# Patient Record
Sex: Male | Born: 1955
Health system: Southern US, Community
[De-identification: ages and names within clinical notes are randomized; demographics above are authoritative.]

## PROBLEM LIST (undated history)

## (undated) DIAGNOSIS — G20A1 Parkinson's disease without dyskinesia, without mention of fluctuations: Secondary | ICD-10-CM

## (undated) DIAGNOSIS — T7840XA Allergy, unspecified, initial encounter: Secondary | ICD-10-CM

## (undated) DIAGNOSIS — J45909 Unspecified asthma, uncomplicated: Secondary | ICD-10-CM

## (undated) DIAGNOSIS — E785 Hyperlipidemia, unspecified: Secondary | ICD-10-CM

## (undated) DIAGNOSIS — G709 Myoneural disorder, unspecified: Secondary | ICD-10-CM

## (undated) DIAGNOSIS — I10 Essential (primary) hypertension: Secondary | ICD-10-CM

## (undated) HISTORY — DX: Allergy, unspecified, initial encounter: T78.40XA

## (undated) HISTORY — DX: Myoneural disorder, unspecified: G70.9

## (undated) HISTORY — DX: Parkinson's disease without dyskinesia, without mention of fluctuations: G20.A1

## (undated) HISTORY — DX: Essential (primary) hypertension: I10

## (undated) HISTORY — DX: Unspecified asthma, uncomplicated: J45.909

## (undated) HISTORY — PX: HERNIA REPAIR: SHX51

## (undated) HISTORY — PX: VASECTOMY: SHX75

## (undated) HISTORY — DX: Hyperlipidemia, unspecified: E78.5

---

## 1998-06-18 ENCOUNTER — Ambulatory Visit (HOSPITAL_COMMUNITY): Admission: RE | Admit: 1998-06-18 | Discharge: 1998-06-18 | Payer: Self-pay | Admitting: Gastroenterology

## 1998-10-01 ENCOUNTER — Ambulatory Visit (HOSPITAL_COMMUNITY): Admission: RE | Admit: 1998-10-01 | Discharge: 1998-10-01 | Payer: Self-pay | Admitting: Gastroenterology

## 1999-11-04 ENCOUNTER — Ambulatory Visit (HOSPITAL_BASED_OUTPATIENT_CLINIC_OR_DEPARTMENT_OTHER): Admission: RE | Admit: 1999-11-04 | Discharge: 1999-11-04 | Payer: Self-pay | Admitting: Urology

## 2006-09-14 ENCOUNTER — Ambulatory Visit: Payer: Self-pay | Admitting: Family Medicine

## 2006-09-14 LAB — CONVERTED CEMR LAB
CO2: 30 meq/L (ref 19–32)
Chloride: 107 meq/L (ref 96–112)
Cholesterol: 171 mg/dL (ref 0–200)
GFR calc non Af Amer: 84 mL/min
Glucose, Bld: 100 mg/dL — ABNORMAL HIGH (ref 70–99)
HDL: 56.2 mg/dL (ref >39.0)
LDL Cholesterol: 105 mg/dL — ABNORMAL HIGH (ref 0–99)
PSA: 0.73 ng/mL (ref 0.10–4.00)
Sodium: 142 meq/L (ref 135–145)
Total CHOL/HDL Ratio: 3
Uric Acid, Serum: 4.2 mg/dL (ref 2.4–7.0)

## 2006-10-19 ENCOUNTER — Ambulatory Visit: Payer: Self-pay | Admitting: Family Medicine

## 2006-10-19 LAB — CONVERTED CEMR LAB
Calcium: 9.4 mg/dL (ref 8.4–10.5)
Chloride: 102 meq/L (ref 96–112)
Creatinine, Ser: 0.9 mg/dL (ref 0.4–1.5)
GFR calc non Af Amer: 95 mL/min
Sodium: 141 meq/L (ref 135–145)

## 2006-12-06 ENCOUNTER — Encounter (INDEPENDENT_AMBULATORY_CARE_PROVIDER_SITE_OTHER): Payer: Self-pay | Admitting: Family Medicine

## 2007-04-11 ENCOUNTER — Telehealth (INDEPENDENT_AMBULATORY_CARE_PROVIDER_SITE_OTHER): Payer: Self-pay | Admitting: *Deleted

## 2007-04-16 ENCOUNTER — Telehealth (INDEPENDENT_AMBULATORY_CARE_PROVIDER_SITE_OTHER): Payer: Self-pay | Admitting: *Deleted

## 2007-05-08 ENCOUNTER — Telehealth (INDEPENDENT_AMBULATORY_CARE_PROVIDER_SITE_OTHER): Payer: Self-pay | Admitting: Family Medicine

## 2007-06-18 ENCOUNTER — Telehealth (INDEPENDENT_AMBULATORY_CARE_PROVIDER_SITE_OTHER): Payer: Self-pay | Admitting: *Deleted

## 2007-07-01 ENCOUNTER — Telehealth (INDEPENDENT_AMBULATORY_CARE_PROVIDER_SITE_OTHER): Payer: Self-pay | Admitting: *Deleted

## 2007-07-29 ENCOUNTER — Telehealth (INDEPENDENT_AMBULATORY_CARE_PROVIDER_SITE_OTHER): Payer: Self-pay | Admitting: *Deleted

## 2007-10-17 ENCOUNTER — Ambulatory Visit: Payer: Self-pay | Admitting: Family Medicine

## 2007-10-17 LAB — CONVERTED CEMR LAB
CO2: 29 meq/L (ref 19–32)
Calcium: 9.6 mg/dL (ref 8.4–10.5)
Cholesterol: 160 mg/dL (ref 0–200)
GFR calc Af Amer: 101 mL/min
GFR calc non Af Amer: 83 mL/min
Glucose, Bld: 86 mg/dL (ref 70–99)
HDL: 69.4 mg/dL (ref >39.0)
LDL Cholesterol: 81 mg/dL (ref 0–99)
PSA: 0.61 ng/mL (ref 0.10–4.00)
Potassium: 3.6 meq/L (ref 3.5–5.1)
Sodium: 136 meq/L (ref 135–145)
Total CHOL/HDL Ratio: 2.3
Triglycerides: 50 mg/dL (ref 0–149)

## 2007-10-18 ENCOUNTER — Encounter (INDEPENDENT_AMBULATORY_CARE_PROVIDER_SITE_OTHER): Payer: Self-pay | Admitting: *Deleted

## 2007-11-05 ENCOUNTER — Telehealth (INDEPENDENT_AMBULATORY_CARE_PROVIDER_SITE_OTHER): Payer: Self-pay | Admitting: *Deleted

## 2007-12-10 ENCOUNTER — Telehealth (INDEPENDENT_AMBULATORY_CARE_PROVIDER_SITE_OTHER): Payer: Self-pay | Admitting: *Deleted

## 2007-12-13 ENCOUNTER — Encounter (INDEPENDENT_AMBULATORY_CARE_PROVIDER_SITE_OTHER): Payer: Self-pay | Admitting: *Deleted

## 2008-02-03 ENCOUNTER — Telehealth (INDEPENDENT_AMBULATORY_CARE_PROVIDER_SITE_OTHER): Payer: Self-pay | Admitting: *Deleted

## 2008-02-03 ENCOUNTER — Encounter (INDEPENDENT_AMBULATORY_CARE_PROVIDER_SITE_OTHER): Payer: Self-pay | Admitting: *Deleted

## 2008-05-05 ENCOUNTER — Telehealth (INDEPENDENT_AMBULATORY_CARE_PROVIDER_SITE_OTHER): Payer: Self-pay | Admitting: *Deleted

## 2008-05-07 ENCOUNTER — Telehealth (INDEPENDENT_AMBULATORY_CARE_PROVIDER_SITE_OTHER): Payer: Self-pay | Admitting: *Deleted

## 2008-06-03 ENCOUNTER — Encounter (INDEPENDENT_AMBULATORY_CARE_PROVIDER_SITE_OTHER): Payer: Self-pay | Admitting: *Deleted

## 2008-07-29 ENCOUNTER — Telehealth (INDEPENDENT_AMBULATORY_CARE_PROVIDER_SITE_OTHER): Payer: Self-pay | Admitting: *Deleted

## 2008-09-02 ENCOUNTER — Telehealth (INDEPENDENT_AMBULATORY_CARE_PROVIDER_SITE_OTHER): Payer: Self-pay | Admitting: *Deleted

## 2008-10-12 ENCOUNTER — Telehealth (INDEPENDENT_AMBULATORY_CARE_PROVIDER_SITE_OTHER): Payer: Self-pay | Admitting: *Deleted

## 2008-10-20 ENCOUNTER — Encounter (INDEPENDENT_AMBULATORY_CARE_PROVIDER_SITE_OTHER): Payer: Self-pay | Admitting: *Deleted

## 2008-10-20 ENCOUNTER — Ambulatory Visit: Payer: Self-pay | Admitting: Family Medicine

## 2008-10-21 ENCOUNTER — Encounter: Payer: Self-pay | Admitting: Family Medicine

## 2008-10-28 ENCOUNTER — Encounter (INDEPENDENT_AMBULATORY_CARE_PROVIDER_SITE_OTHER): Payer: Self-pay | Admitting: *Deleted

## 2008-11-26 ENCOUNTER — Telehealth (INDEPENDENT_AMBULATORY_CARE_PROVIDER_SITE_OTHER): Payer: Self-pay | Admitting: *Deleted

## 2008-12-02 ENCOUNTER — Encounter (INDEPENDENT_AMBULATORY_CARE_PROVIDER_SITE_OTHER): Payer: Self-pay | Admitting: *Deleted

## 2008-12-07 ENCOUNTER — Telehealth (INDEPENDENT_AMBULATORY_CARE_PROVIDER_SITE_OTHER): Payer: Self-pay | Admitting: *Deleted

## 2008-12-24 ENCOUNTER — Telehealth (INDEPENDENT_AMBULATORY_CARE_PROVIDER_SITE_OTHER): Payer: Self-pay | Admitting: *Deleted

## 2009-01-06 ENCOUNTER — Telehealth (INDEPENDENT_AMBULATORY_CARE_PROVIDER_SITE_OTHER): Payer: Self-pay | Admitting: *Deleted

## 2009-01-20 ENCOUNTER — Telehealth (INDEPENDENT_AMBULATORY_CARE_PROVIDER_SITE_OTHER): Payer: Self-pay | Admitting: *Deleted

## 2009-02-09 ENCOUNTER — Telehealth (INDEPENDENT_AMBULATORY_CARE_PROVIDER_SITE_OTHER): Payer: Self-pay | Admitting: *Deleted

## 2009-03-25 ENCOUNTER — Encounter: Payer: Self-pay | Admitting: Family Medicine

## 2009-04-29 ENCOUNTER — Telehealth (INDEPENDENT_AMBULATORY_CARE_PROVIDER_SITE_OTHER): Payer: Self-pay | Admitting: *Deleted

## 2009-05-10 ENCOUNTER — Telehealth (INDEPENDENT_AMBULATORY_CARE_PROVIDER_SITE_OTHER): Payer: Self-pay | Admitting: *Deleted

## 2009-05-11 ENCOUNTER — Telehealth: Payer: Self-pay | Admitting: Family Medicine

## 2009-05-14 ENCOUNTER — Telehealth: Payer: Self-pay | Admitting: Family Medicine

## 2009-05-17 ENCOUNTER — Telehealth (INDEPENDENT_AMBULATORY_CARE_PROVIDER_SITE_OTHER): Payer: Self-pay | Admitting: *Deleted

## 2009-07-05 ENCOUNTER — Encounter: Payer: Self-pay | Admitting: Family

## 2009-07-05 ENCOUNTER — Telehealth (INDEPENDENT_AMBULATORY_CARE_PROVIDER_SITE_OTHER): Payer: Self-pay | Admitting: *Deleted

## 2009-07-12 ENCOUNTER — Telehealth (INDEPENDENT_AMBULATORY_CARE_PROVIDER_SITE_OTHER): Payer: Self-pay | Admitting: *Deleted

## 2009-07-13 ENCOUNTER — Ambulatory Visit: Payer: Self-pay | Admitting: Family Medicine

## 2009-07-19 LAB — CONVERTED CEMR LAB
Albumin: 4.6 g/dL (ref 3.5–5.2)
HDL: 71.1 mg/dL (ref >39.00)
LDL Cholesterol: 90 mg/dL (ref 0–99)
Total Bilirubin: 1.2 mg/dL (ref 0.3–1.2)
Total CHOL/HDL Ratio: 2
Triglycerides: 63 mg/dL (ref 0.0–149.0)

## 2009-08-02 ENCOUNTER — Telehealth: Payer: Self-pay | Admitting: Family Medicine

## 2009-08-03 ENCOUNTER — Telehealth (INDEPENDENT_AMBULATORY_CARE_PROVIDER_SITE_OTHER): Payer: Self-pay | Admitting: *Deleted

## 2009-08-06 ENCOUNTER — Telehealth: Payer: Self-pay | Admitting: Family Medicine

## 2009-08-09 ENCOUNTER — Telehealth: Payer: Self-pay | Admitting: Family Medicine

## 2009-08-10 ENCOUNTER — Telehealth (INDEPENDENT_AMBULATORY_CARE_PROVIDER_SITE_OTHER): Payer: Self-pay | Admitting: *Deleted

## 2009-08-30 ENCOUNTER — Telehealth (INDEPENDENT_AMBULATORY_CARE_PROVIDER_SITE_OTHER): Payer: Self-pay | Admitting: *Deleted

## 2009-08-31 ENCOUNTER — Telehealth (INDEPENDENT_AMBULATORY_CARE_PROVIDER_SITE_OTHER): Payer: Self-pay | Admitting: *Deleted

## 2009-09-06 ENCOUNTER — Telehealth: Payer: Self-pay | Admitting: Family Medicine

## 2009-10-04 ENCOUNTER — Telehealth (INDEPENDENT_AMBULATORY_CARE_PROVIDER_SITE_OTHER): Payer: Self-pay | Admitting: *Deleted

## 2009-10-07 ENCOUNTER — Telehealth (INDEPENDENT_AMBULATORY_CARE_PROVIDER_SITE_OTHER): Payer: Self-pay | Admitting: *Deleted

## 2009-10-22 ENCOUNTER — Ambulatory Visit: Payer: Self-pay | Admitting: Family Medicine

## 2009-11-01 ENCOUNTER — Telehealth (INDEPENDENT_AMBULATORY_CARE_PROVIDER_SITE_OTHER): Payer: Self-pay | Admitting: *Deleted

## 2009-11-02 ENCOUNTER — Telehealth (INDEPENDENT_AMBULATORY_CARE_PROVIDER_SITE_OTHER): Payer: Self-pay | Admitting: *Deleted

## 2010-01-11 ENCOUNTER — Ambulatory Visit: Payer: Self-pay | Admitting: Family Medicine

## 2010-01-12 LAB — CONVERTED CEMR LAB
AST: 31 units/L (ref 0–37)
Albumin: 4.6 g/dL (ref 3.5–5.2)
Alkaline Phosphatase: 52 units/L (ref 39–117)
Cholesterol: 174 mg/dL (ref 0–200)
Total Protein: 7 g/dL (ref 6.0–8.3)
VLDL: 17.8 mg/dL (ref 0.0–40.0)

## 2010-01-25 ENCOUNTER — Telehealth (INDEPENDENT_AMBULATORY_CARE_PROVIDER_SITE_OTHER): Payer: Self-pay | Admitting: *Deleted

## 2010-02-07 ENCOUNTER — Telehealth (INDEPENDENT_AMBULATORY_CARE_PROVIDER_SITE_OTHER): Payer: Self-pay | Admitting: *Deleted

## 2010-02-23 ENCOUNTER — Telehealth (INDEPENDENT_AMBULATORY_CARE_PROVIDER_SITE_OTHER): Payer: Self-pay | Admitting: *Deleted

## 2010-05-30 ENCOUNTER — Telehealth (INDEPENDENT_AMBULATORY_CARE_PROVIDER_SITE_OTHER): Payer: Self-pay | Admitting: *Deleted

## 2010-06-07 ENCOUNTER — Telehealth: Payer: Self-pay | Admitting: Family Medicine

## 2010-09-05 ENCOUNTER — Telehealth (INDEPENDENT_AMBULATORY_CARE_PROVIDER_SITE_OTHER): Payer: Self-pay | Admitting: *Deleted

## 2010-09-19 ENCOUNTER — Telehealth (INDEPENDENT_AMBULATORY_CARE_PROVIDER_SITE_OTHER): Payer: Self-pay | Admitting: *Deleted

## 2010-09-23 ENCOUNTER — Other Ambulatory Visit: Payer: Self-pay | Admitting: Family Medicine

## 2010-09-23 ENCOUNTER — Ambulatory Visit
Admission: RE | Admit: 2010-09-23 | Discharge: 2010-09-23 | Payer: Self-pay | Source: Home / Self Care | Attending: Family Medicine | Admitting: Family Medicine

## 2010-09-23 LAB — CBC WITH DIFFERENTIAL/PLATELET
Basophils Absolute: 0 10*3/uL (ref 0.0–0.1)
Basophils Relative: 0.6 % (ref 0.0–3.0)
Eosinophils Absolute: 0.2 10*3/uL (ref 0.0–0.7)
Eosinophils Relative: 4.2 % (ref 0.0–5.0)
HCT: 44.3 % (ref 39.0–52.0)
Hemoglobin: 15.4 g/dL (ref 13.0–17.0)
Lymphocytes Relative: 45.9 % (ref 12.0–46.0)
Lymphs Abs: 2.4 10*3/uL (ref 0.7–4.0)
MCHC: 34.8 g/dL (ref 30.0–36.0)
MCV: 97.5 fl (ref 78.0–100.0)
Monocytes Absolute: 0.4 10*3/uL (ref 0.1–1.0)
Monocytes Relative: 7.3 % (ref 3.0–12.0)
Neutro Abs: 2.2 10*3/uL (ref 1.4–7.7)
Neutrophils Relative %: 42 % — ABNORMAL LOW (ref 43.0–77.0)
Platelets: 190 10*3/uL (ref 150.0–400.0)
RBC: 4.55 Mil/uL (ref 4.22–5.81)
RDW: 13.4 % (ref 11.5–14.6)
WBC: 5.3 10*3/uL (ref 4.5–10.5)

## 2010-09-23 LAB — LIPID PANEL
Cholesterol: 164 mg/dL (ref 0–200)
HDL: 60.7 mg/dL (ref >39.00)
LDL Cholesterol: 94 mg/dL (ref 0–99)
Total CHOL/HDL Ratio: 3
Triglycerides: 47 mg/dL (ref 0.0–149.0)
VLDL: 9.4 mg/dL (ref 0.0–40.0)

## 2010-09-23 LAB — BASIC METABOLIC PANEL
BUN: 27 mg/dL — ABNORMAL HIGH (ref 6–23)
CO2: 28 mEq/L (ref 19–32)
Calcium: 9.3 mg/dL (ref 8.4–10.5)
Chloride: 103 mEq/L (ref 96–112)
Creatinine, Ser: 0.9 mg/dL (ref 0.4–1.5)
GFR: 91.76 mL/min (ref >60.00)
Glucose, Bld: 96 mg/dL (ref 70–99)
Potassium: 4.1 mEq/L (ref 3.5–5.1)
Sodium: 138 mEq/L (ref 135–145)

## 2010-09-23 LAB — HEPATIC FUNCTION PANEL
ALT: 21 U/L (ref 0–53)
AST: 26 U/L (ref 0–37)
Albumin: 4.3 g/dL (ref 3.5–5.2)
Alkaline Phosphatase: 58 U/L (ref 39–117)
Bilirubin, Direct: 0.1 mg/dL (ref 0.0–0.3)
Total Bilirubin: 0.9 mg/dL (ref 0.3–1.2)
Total Protein: 6.9 g/dL (ref 6.0–8.3)

## 2010-09-23 LAB — URIC ACID: Uric Acid, Serum: 4.8 mg/dL (ref 4.0–7.8)

## 2010-09-23 LAB — TSH: TSH: 1.06 u[IU]/mL (ref 0.35–5.50)

## 2010-09-23 LAB — PSA: PSA: 0.81 ng/mL (ref 0.10–4.00)

## 2010-09-25 LAB — CONVERTED CEMR LAB
ALT: 22 units/L (ref 0–53)
ALT: 25 units/L (ref 0–53)
AST: 31 units/L (ref 0–37)
Albumin: 4.4 g/dL (ref 3.5–5.2)
Alkaline Phosphatase: 47 units/L (ref 39–117)
Alkaline Phosphatase: 57 units/L (ref 39–117)
BUN: 16 mg/dL (ref 6–23)
BUN: 20 mg/dL (ref 6–23)
Basophils Absolute: 0.1 10*3/uL (ref 0.0–0.1)
Basophils Relative: 1 % (ref 0.0–3.0)
Bilirubin, Direct: 0 mg/dL (ref 0.0–0.3)
CO2: 30 meq/L (ref 19–32)
Calcium: 9.4 mg/dL (ref 8.4–10.5)
Chloride: 101 meq/L (ref 96–112)
Cholesterol: 172 mg/dL (ref 0–200)
Creatinine, Ser: 1 mg/dL (ref 0.4–1.5)
Eosinophils Absolute: 0.3 10*3/uL (ref 0.0–0.7)
Eosinophils Relative: 5.4 % — ABNORMAL HIGH (ref 0.0–5.0)
GFR calc non Af Amer: 82.58 mL/min (ref >60)
GFR calc non Af Amer: 94 mL/min
Glucose, Bld: 91 mg/dL (ref 70–99)
HCT: 44.7 % (ref 39.0–52.0)
HDL: 72.7 mg/dL (ref >39.0)
HDL: 75.3 mg/dL (ref >39.00)
LDL Cholesterol: 97 mg/dL (ref 0–99)
Lymphocytes Relative: 33.7 % (ref 12.0–46.0)
Lymphocytes Relative: 43.3 % (ref 12.0–46.0)
Lymphs Abs: 2 10*3/uL (ref 0.7–4.0)
MCV: 97.3 fL (ref 78.0–100.0)
Monocytes Relative: 6.6 % (ref 3.0–12.0)
Neutrophils Relative %: 44.8 % (ref 43.0–77.0)
Neutrophils Relative %: 55 % (ref 43.0–77.0)
PSA: 0.67 ng/mL (ref 0.10–4.00)
Platelets: 160 10*3/uL (ref 150.0–400.0)
Platelets: 180 10*3/uL (ref 150–400)
Potassium: 3.6 meq/L (ref 3.5–5.1)
RBC: 4.72 M/uL (ref 4.22–5.81)
RDW: 12.6 % (ref 11.5–14.6)
Total Bilirubin: 1.4 mg/dL — ABNORMAL HIGH (ref 0.3–1.2)
Total Protein: 7 g/dL (ref 6.0–8.3)
Triglycerides: 65 mg/dL (ref 0.0–149.0)
VLDL: 11 mg/dL (ref 0–40)
VLDL: 13 mg/dL (ref 0.0–40.0)
WBC: 5.7 10*3/uL (ref 4.5–10.5)

## 2010-09-27 ENCOUNTER — Telehealth (INDEPENDENT_AMBULATORY_CARE_PROVIDER_SITE_OTHER): Payer: Self-pay | Admitting: *Deleted

## 2010-09-27 NOTE — Progress Notes (Signed)
Summary: PRIOR AUTH denied for  DIOVAN--COVENTRY  Phone Note Refill Request Message from:  Fax from Pharmacy on [place] [phone]  Refills Requested: Medication #1:  DIOVAN 320 MG TABS 1 by mouth once daily PRIOR AUTHG [id number]  Initial call taken by: Barb Merino,  August 31, 2009 2:42 PM  Follow-up for Phone Call        prior auth denied for diovan  rx has been changed to lisinopril Follow-up by: Kandice Hams,  September 06, 2009 5:05 PM

## 2010-09-27 NOTE — Progress Notes (Signed)
Summary: diovan refill   Phone Note Call from Patient Call back at [phone]   Caller: Patient Reason for Call: Refill Medication Summary of Call: PATIENT CAME IN WANTING A REFILL ON HIS BLOOP PRESSURE MED--DIOVAN 320 MG IS WHAT IS WORKING FOR HIM. PLEASE SENT TO [facility]  ON [address] Initial call taken by: Freddy Jaksch,  August 30, 2009 12:41 PM    Prescriptions: DIOVAN 320 MG TABS (VALSARTAN) 1 by mouth once daily  #90 x 1   Entered by:   Doristine Devoid   Authorized by:   Neena Rhymes MD   Signed by:   Doristine Devoid on 08/31/2009   Method used:   Electronically to        [facility] [address] (retail)       [address]       [place], [place]  [address]       Ph: [phone]       Fax: [phone]   RxID:   [id number]

## 2010-09-27 NOTE — Progress Notes (Signed)
Summary: BP med change   Phone Note Call from Patient   Summary of Call: Pt called and left a voicemail stating his insurance does not cover Diovan. Can he switch to Lisinopril or Benazapril? Please advise.  Initial call taken by: Army Fossa CMA,  September 06, 2009 12:47 PM  Follow-up for Phone Call        per dr Laury Axon lisinopril hct 20/25 1 by mouth daily.  Follow-up by: Army Fossa CMA,  September 06, 2009 4:31 PM    New/Updated Medications: LISINOPRIL-HYDROCHLOROTHIAZIDE 20-25 MG TABS (LISINOPRIL-HYDROCHLOROTHIAZIDE) 1 by mouth daily. Prescriptions: LISINOPRIL-HYDROCHLOROTHIAZIDE 20-25 MG TABS (LISINOPRIL-HYDROCHLOROTHIAZIDE) 1 by mouth daily.  #30 x 2   Entered by:   Army Fossa CMA   Authorized by:   Loreen Freud DO   Signed by:   Army Fossa CMA on 09/06/2009   Method used:   Electronically to        [facility] [address] (retail)       [address]       [place], [place]  [address]       Ph: [phone]       Fax: [phone]   RxID:   [id number]

## 2010-09-27 NOTE — Progress Notes (Signed)
Summary: allopurinol refill   Phone Note Refill Request Message from:  Fax from Pharmacy on November 01, 2009 8:51 AM  Refills Requested: Medication #1:  ALLOPURINOL 300 MG TABS Take one tablet daily [facility] fax [phone]   Method Requested: Fax to Local Pharmacy Next Appointment Scheduled: no appt Initial call taken by: Barb Merino,  November 01, 2009 8:51 AM    Prescriptions: ALLOPURINOL 300 MG TABS (ALLOPURINOL) Take one tablet daily  #90 x 0   Entered by:   Doristine Devoid   Authorized by:   Neena Rhymes MD   Signed by:   Doristine Devoid on 11/01/2009   Method used:   Electronically to        [facility] [address] (retail)       [address]       [place], [place]  [address]       Ph: [phone]       Fax: [phone]   RxID:   [id number]

## 2010-09-27 NOTE — Progress Notes (Signed)
Summary: allopurinol, norvasc, lisinopril/hctz refill   Phone Note Refill Request Message from:  Fax from Pharmacy on May 30, 2010 11:38 AM  Refills Requested: Medication #1:  ALLOPURINOL 300 MG TABS Take one tablet daily  Medication #2:  NORVASC 10 MG TABS take 1 tab once daily.  Medication #3:  LISINOPRIL-HYDROCHLOROTHIAZIDE 20-25 MG TABS 1 by mouth daily. [facility] [address] - fax [phone]  Initial call taken by: Okey Regal [place],  May 30, 2010 11:41 AM    Prescriptions: NORVASC 10 MG TABS (AMLODIPINE BESYLATE) take 1 tab once daily  #90 x 0   Entered by:   Doristine Devoid CMA   Authorized by:   Neena Rhymes MD   Signed by:   Doristine Devoid CMA on 05/30/2010   Method used:   Electronically to        [facility] [address] (retail)       [address]       [place], [place]  [address]       Ph: [phone]       Fax: [phone]   RxID:   [id number] LISINOPRIL-HYDROCHLOROTHIAZIDE 20-25 MG TABS (LISINOPRIL-HYDROCHLOROTHIAZIDE) 1 by mouth daily.  #90 Tablet x 0   Entered by:   Doristine Devoid CMA   Authorized by:   Neena Rhymes MD   Signed by:   Doristine Devoid CMA on 05/30/2010   Method used:   Electronically to        [facility] [address] (retail)       [address]       [place], [place]  [address]       Ph: [phone]       Fax: [phone]   RxID:   [id number] ALLOPURINOL 300 MG TABS (ALLOPURINOL) Take one tablet daily  #90 x 0   Entered by:   Doristine Devoid CMA   Authorized by:   Neena Rhymes MD   Signed by:   Doristine Devoid CMA on 05/30/2010   Method used:   Electronically to        [facility] [address] (retail)       [address]       [place], [place]  [address]       Ph: [phone]       Fax: [phone]   RxID:   [id number]

## 2010-09-27 NOTE — Assessment & Plan Note (Signed)
Summary: cpx/ns/kdc   Vital Signs:  Patient profile:   55 year old male Height:      67 inches Weight:      145 pounds BMI:     22.79 Temp:     98.1 degrees F oral Pulse rate:   66 / minute Pulse rhythm:   regular BP sitting:   110 / 70  (left arm) Cuff size:   large  Vitals Entered By: Army Fossa CMA (October 22, 2009 8:39 AM) CC: CPX, no complaints   History of Present Illness: Pt here for cpe and labs.  No complaints.   Preventive Screening-Counseling & Management  Alcohol-Tobacco     Alcohol drinks/day: <1     Alcohol type: occas--wine     Smoking Status: never     Passive Smoke Exposure: no  Caffeine-Diet-Exercise     Caffeine use/day: 0     Does Patient Exercise: yes     Type of exercise: gym      Exercise (avg: min/session): 30-60     Times/week: 6  Hep-HIV-STD-Contraception     Dental Visit-last 6 months yes     Dental Care Counseling: not indicated; dental care within six months  Safety-Violence-Falls     Seat Belt Use: 100      Sexual History:  currently monogamous and married.        Drug Use:  never.    Current Medications (verified): 1)  Lipitor 20 Mg Tabs (Atorvastatin Calcium) .... Take 1 Tablet By Mouth Once A Day**labs Due Now** 2)  Allopurinol 300 Mg Tabs (Allopurinol) .Marland Kitchen.. 1 Tablet By Mouth Once A Day 3)  Proair Hfa 108 (90 Base) Mcg/act Aers (Albuterol Sulfate) .... 2 Puffs Qid As Needed 4)  Lisinopril-Hydrochlorothiazide 20-25 Mg Tabs (Lisinopril-Hydrochlorothiazide) .Marland Kitchen.. 1 By Mouth Daily. 5)  Clotrimazole-Betamethasone 1-0.05 % Crea (Clotrimazole-Betamethasone) .... Apply Two Times A Day  Allergies (verified): No Known Drug Allergies  Past History:  Past Medical History: Last updated: 01/02/2007 Gout Hyperlipidemia Hypertension Colonic polyps, hx of  Family History: Last updated: 10/22/2009 HTN CVA Family History High cholesterol Family History of Arthritis  Social History: Last updated: 10/17/2007 Occupation:  Dentist Married Never Smoked Alcohol use-yes: socially Drug use-no Regular exercise-yes: very active  Risk Factors: Alcohol Use: <1 (10/22/2009) Caffeine Use: 0 (10/22/2009) Exercise: yes (10/22/2009)  Risk Factors: Smoking Status: never (10/22/2009) Passive Smoke Exposure: no (10/22/2009)  Past Surgical History: Denies surgical history Inguinal herniorrhaphy  B/L --2010  Family History: Reviewed history from 10/20/2008 and no changes required. HTN CVA Family History High cholesterol Family History of Arthritis  Social History: Reviewed history from 10/17/2007 and no changes required. Occupation: Education officer, community Married Never Smoked Alcohol use-yes: socially Drug use-no Regular exercise-yes: very active Dental Care w/in 6 mos.:  yes Sexual History:  currently monogamous, married Drug Use:  never  Review of Systems      See HPI General:  Denies chills, fatigue, fever, loss of appetite, malaise, sleep disorder, sweats, weakness, and weight loss. Eyes:  Denies blurring, discharge, double vision, eye irritation, eye pain, halos, itching, light sensitivity, red eye, vision loss-1 eye, and vision loss-both eyes; optho-- q1y. ENT:  Denies decreased hearing, difficulty swallowing, ear discharge, earache, hoarseness, nasal congestion, nosebleeds, postnasal drainage, ringing in ears, sinus pressure, and sore throat. CV:  Denies bluish discoloration of lips or nails, chest pain or discomfort, difficulty breathing at night, difficulty breathing while lying down, fainting, fatigue, leg cramps with exertion, lightheadness, near fainting, palpitations, shortness of breath with exertion, swelling of  feet, swelling of hands, and weight gain. Resp:  Denies chest discomfort, chest pain with inspiration, cough, coughing up blood, excessive snoring, hypersomnolence, morning headaches, pleuritic, shortness of breath, sputum productive, and wheezing. GI:  Denies abdominal pain, bloody stools, change  in bowel habits, constipation, dark tarry stools, diarrhea, excessive appetite, gas, hemorrhoids, indigestion, loss of appetite, nausea, vomiting, vomiting blood, and yellowish skin color. GU:  Denies decreased libido, discharge, dysuria, erectile dysfunction, genital sores, hematuria, incontinence, nocturia, urinary frequency, and urinary hesitancy. MS:  Denies joint pain, joint redness, joint swelling, loss of strength, low back pain, mid back pain, muscle aches, muscle , cramps, muscle weakness, stiffness, and thoracic pain. Derm:  Denies changes in color of skin, changes in nail beds, dryness, excessive perspiration, flushing, hair loss, insect bite(s), itching, lesion(s), poor wound healing, and rash. Neuro:  Denies brief paralysis, difficulty with concentration, disturbances in coordination, falling down, headaches, inability to speak, memory loss, numbness, poor balance, seizures, sensation of room spinning, tingling, tremors, visual disturbances, and weakness. Endo:  Denies cold intolerance, excessive hunger, excessive thirst, excessive urination, heat intolerance, polyuria, and weight change. Heme:  Denies abnormal bruising, bleeding, enlarge lymph nodes, fevers, pallor, and skin discoloration. Allergy:  Denies hives or rash, itching eyes, persistent infections, seasonal allergies, and sneezing.  Physical Exam  General:  Well-developed,well-nourished,in no acute distress; alert,appropriate and cooperative throughout examination Head:  Normocephalic and atraumatic without obvious abnormalities. No apparent alopecia or balding. Eyes:  pupils equal, pupils round, pupils reactive to light, and no injection.   Ears:  External ear exam shows no significant lesions or deformities.  Otoscopic examination reveals clear canals, tympanic membranes are intact bilaterally without bulging, retraction, inflammation or discharge. Hearing is grossly normal bilaterally. Nose:  External nasal examination shows  no deformity or inflammation. Nasal mucosa are pink and moist without lesions or exudates. Mouth:  Oral mucosa and oropharynx without lesions or exudates.  Teeth in good repair. Neck:  No deformities, masses, or tenderness noted.no carotid bruits.   Chest Wall:  No deformities, masses, tenderness or gynecomastia noted. Lungs:  Normal respiratory effort, chest expands symmetrically. Lungs are clear to auscultation, no crackles or wheezes. Heart:  normal rate and no murmur.   Abdomen:  Bowel sounds positive,abdomen soft and non-tender without masses, organomegaly or hernias noted. Rectal:  normal sphincter tone, no masses, no tenderness, and external hemorrhoid(s).  Heme negative brown stool. Genitalia:  Testes bilaterally descended without nodularity, tenderness or masses. No scrotal masses or lesions. No penis lesions or urethral discharge. Prostate:  Prostate gland firm and smooth, no enlargement, nodularity, tenderness, mass, asymmetry or induration. Msk:  normal ROM, no joint tenderness, no joint swelling, no joint warmth, no redness over joints, no joint deformities, no joint instability, and no crepitation.   Pulses:  R posterior tibial normal, R dorsalis pedis normal, R carotid normal, L posterior tibial normal, L dorsalis pedis normal, and L carotid normal.   Extremities:  No clubbing, cyanosis, edema, or deformity noted with normal full range of motion of all joints.   Neurologic:  No cranial nerve deficits noted. Station and gait are normal. Plantar reflexes are down-going bilaterally. DTRs are symmetrical throughout. Sensory, motor and coordinative functions appear intact. Skin:  Intact without suspicious lesions or rashes Cervical Nodes:  No lymphadenopathy noted Axillary Nodes:  No palpable lymphadenopathy Psych:  Cognition and judgment appear intact. Alert and cooperative with normal attention span and concentration. No apparent delusions, illusions, hallucinations   Impression &  Recommendations:  Problem # 1:  PREVENTIVE HEALTH CARE (ICD-V70.0)  Orders: Venipuncture ([address]) TLB-Lipid Panel (80061-LIPID) TLB-BMP (Basic Metabolic Panel-BMET) (80048-METABOL) TLB-CBC Platelet - w/Differential (85025-CBCD) TLB-Hepatic/Liver Function Pnl (80076-HEPATIC) TLB-TSH (Thyroid Stimulating Hormone) (84443-TSH) TLB-Uric Acid, Blood (84550-URIC) TLB-PSA (Prostate Specific Antigen) (84153-PSA) EKG w/ Interpretation (93000)  Reviewed preventive care protocols, scheduled due services, and updated immunizations.  Problem # 2:  HYPERTENSION (ICD-401.9)  The following medications were removed from the medication list:    Diovan Hct 320-25 Mg Tabs (Valsartan-hydrochlorothiazide) .Marland Kitchen... 1 by mouth daily. His updated medication list for this problem includes:    Lisinopril-hydrochlorothiazide 20-25 Mg Tabs (Lisinopril-hydrochlorothiazide) .Marland Kitchen... 1 by mouth daily.  Orders: Venipuncture ([address]) TLB-Lipid Panel (80061-LIPID) TLB-BMP (Basic Metabolic Panel-BMET) (80048-METABOL) TLB-CBC Platelet - w/Differential (85025-CBCD) TLB-Hepatic/Liver Function Pnl (80076-HEPATIC) TLB-TSH (Thyroid Stimulating Hormone) (84443-TSH) TLB-Uric Acid, Blood (84550-URIC) TLB-PSA (Prostate Specific Antigen) (84153-PSA) EKG w/ Interpretation (93000)  BP today: 110/70 Prior BP: 100/68 (10/20/2008)  Labs Reviewed: K+: 3.6 (10/20/2008) Creat: : 0.9 (10/20/2008)   Chol: 174 (07/13/2009)   HDL: 71.10 (07/13/2009)   LDL: 90 (07/13/2009)   TG: 63.0 (07/13/2009)  Problem # 3:  HYPERLIPIDEMIA (ICD-272.4)  His updated medication list for this problem includes:    Lipitor 20 Mg Tabs (Atorvastatin calcium) .Marland Kitchen... Take 1 tablet by mouth once a day**labs due now**  Orders: Venipuncture (09811) TLB-Lipid Panel (80061-LIPID) TLB-BMP (Basic Metabolic Panel-BMET) (80048-METABOL) TLB-CBC Platelet - w/Differential (85025-CBCD) TLB-Hepatic/Liver Function Pnl (80076-HEPATIC) TLB-TSH (Thyroid Stimulating Hormone)  (84443-TSH) TLB-Uric Acid, Blood (84550-URIC) TLB-PSA (Prostate Specific Antigen) (84153-PSA) EKG w/ Interpretation (93000)  Labs Reviewed: SGOT: 27 (07/13/2009)   SGPT: 22 (07/13/2009)   HDL:71.10 (07/13/2009), 72.7 (10/20/2008)  LDL:90 (07/13/2009), 97 (10/20/2008)  Chol:174 (07/13/2009), 181 (10/20/2008)  Trig:63.0 (07/13/2009), 55 (10/20/2008)  Problem # 4:  GOUT (ICD-274.9)  His updated medication list for this problem includes:    Allopurinol 300 Mg Tabs (Allopurinol) .Marland Kitchen... 1 tablet by mouth once a day  Orders: Venipuncture (91478) TLB-Lipid Panel (80061-LIPID) TLB-BMP (Basic Metabolic Panel-BMET) (80048-METABOL) TLB-CBC Platelet - w/Differential (85025-CBCD) TLB-Hepatic/Liver Function Pnl (80076-HEPATIC) TLB-TSH (Thyroid Stimulating Hormone) (84443-TSH) TLB-Uric Acid, Blood (84550-URIC) TLB-PSA (Prostate Specific Antigen) (84153-PSA) EKG w/ Interpretation (93000)  Elevate extremity; warm compresses, symptomatic relief and medication as directed.   Complete Medication List: 1)  Lipitor 20 Mg Tabs (Atorvastatin calcium) .... Take 1 tablet by mouth once a day**labs due now** 2)  Allopurinol 300 Mg Tabs (Allopurinol) .Marland Kitchen.. 1 tablet by mouth once a day 3)  Proair Hfa 108 (90 Base) Mcg/act Aers (Albuterol sulfate) .... 2 puffs qid as needed 4)  Lisinopril-hydrochlorothiazide 20-25 Mg Tabs (Lisinopril-hydrochlorothiazide) .Marland Kitchen.. 1 by mouth daily. 5)  Clotrimazole-betamethasone 1-0.05 % Crea (Clotrimazole-betamethasone) .... Apply two times a day  Other Orders: Pneumococcal Vaccine (29562) Admin 1st Vaccine (13086)   EKG  Procedure date:  10/22/2009  Findings:      Sinus bradycardia with rate of:  58 bpm       Immunizations Administered:  Pneumonia Vaccine:    Vaccine Type: Pneumovax    Site: right deltoid    Mfr: Merck    Dose: 0.5 ml    Route: IM    Given by: Army Fossa CMA    Exp. Date: 12/16/2010    Lot #: [id number]   Immunizations  Administered:  Pneumonia Vaccine:    Vaccine Type: Pneumovax    Site: right deltoid    Mfr: Merck    Dose: 0.5 ml    Route: IM    Given by: Army Fossa CMA    Exp. Date: 12/16/2010    Lot #:  [id number]

## 2010-09-27 NOTE — Progress Notes (Signed)
Summary: lipitor refill   Phone Note Refill Request Call back at [phone] Message from:  Pharmacy on Jan 25, 2010 8:54 AM  Refills Requested: Medication #1:  LIPITOR 20 MG TABS take 1 tab once daily   Dosage confirmed as above?Dosage Confirmed   Supply Requested: 3 months [facility] [address]   Next Appointment Scheduled: none Initial call taken by: Harold Barban,  Jan 25, 2010 8:54 AM    Prescriptions: LIPITOR 20 MG TABS (ATORVASTATIN CALCIUM) take 1 tab once daily  #90 x 0   Entered by:   Doristine Devoid   Authorized by:   Neena Rhymes MD   Signed by:   Doristine Devoid on 01/25/2010   Method used:   Electronically to        [facility] [address] (retail)       [address]       [place], [place]  [address]       Ph: [phone]       Fax: [phone]   RxID:   [id number]

## 2010-09-27 NOTE — Progress Notes (Signed)
Summary: refill  Phone Note Refill Request Message from:  Fax from Pharmacy on [address] fax [phone]  clotrimazole-betamethasone 30gm  Initial call taken by: Barb Merino,  October 04, 2009 11:46 AM  Follow-up for Phone Call        left message for pt to call back- need to know why medication is needed. I do not see on med list. Army Fossa CMA  October 04, 2009 1:53 PM      Appended Document: Refill request    Phone Note Call from Patient   Caller: Patient Call For: Loreen Freud DO Summary of Call: The medication [facility]ed [facility] is for exzema.  Dr. Laury Axon prescribed it over a year ago and the exzema has returned.  Please approve.  Please call if you have any questions. Initial call taken by: Freddy Jaksch,  October 05, 2009 12:34 PM  Follow-up for Phone Call        ok to refill Follow-up by: Loreen Freud DO,  October 05, 2009 4:42 PM    New/Updated Medications: CLOTRIMAZOLE-BETAMETHASONE 1-0.05 % CREA (CLOTRIMAZOLE-BETAMETHASONE) Apply two times a day Prescriptions: CLOTRIMAZOLE-BETAMETHASONE 1-0.05 % CREA (CLOTRIMAZOLE-BETAMETHASONE) Apply two times a day  #45gm x 0    Entered by:   Army Fossa CMA   Authorized by:   Loreen Freud DO   Signed by:   Army Fossa CMA on 10/05/2009   Method used:   [facility] to ...       [facility] [phone][facility] ([facility]ail)       [address]       [place], [place]  [address]       Ph: [phone]       Fax: [phone]   RxID:   [id number]

## 2010-09-27 NOTE — Progress Notes (Signed)
Summary: elevated bp increase lisinopril  Phone Note Call from Patient   Caller: Patient Summary of Call: patient called says bp has been elevated over the past week at 140/95-100's hasn't had any other symptoms thinks lisinopril should be increase pls advise informed patient will call back tomorrow w/ recommendations.  [facility] [address] Initial call taken by: Doristine Devoid,  February 07, 2010 5:01 PM  Follow-up for Phone Call        change to lisinopril 20/12.5 and take 2 by mouth once daily ---- does pt have f/u appointment? Follow-up by: Loreen Freud DO,  February 08, 2010 8:35 AM  Additional Follow-up for Phone Call Additional follow up Details #1::        left message on machine ..........Marland KitchenDoristine Devoid  February 08, 2010 10:13 AM   spoke w/ patient aware medication change and resent to pharmacy .....Marland KitchenMarland KitchenDoristine Devoid  February 08, 2010 4:36 PM     New/Updated Medications: LISINOPRIL-HYDROCHLOROTHIAZIDE 20-12.5 MG TABS (LISINOPRIL-HYDROCHLOROTHIAZIDE) 2 by mouth once daily Prescriptions: LISINOPRIL-HYDROCHLOROTHIAZIDE 20-12.5 MG TABS (LISINOPRIL-HYDROCHLOROTHIAZIDE) 2 by mouth once daily  #60 x 2   Entered by:   Doristine Devoid   Authorized by:   Loreen Freud DO   Signed by:   Doristine Devoid on 02/08/2010   Method used:   Electronically to        [facility] [address] (retail)       [phone]       [place], [place]  [address]       Ph: [phone]       Fax: [phone]   RxID:   [id number] LISINOPRIL-HYDROCHLOROTHIAZIDE 20-12.5 MG TABS (LISINOPRIL-HYDROCHLOROTHIAZIDE) 2 by mouth once daily  #60 x 2   Entered and Authorized by:   Loreen Freud DO   Signed by:   Loreen Freud DO on 02/08/2010   Method used:   [facility] to        [facility] [phone]* (retail)       [address]       [place], [place]  [address]       Ph: [phone]       Fax: [phone]   RxID:   [id number]

## 2010-09-27 NOTE — Progress Notes (Signed)
  Phone Note Call from Patient   Summary of Call: Pt called requesting 3 month supply of lisinopril-hctz to be sent [facility] [address].     Prescriptions: LISINOPRIL-HYDROCHLOROTHIAZIDE 20-25 MG TABS (LISINOPRIL-HYDROCHLOROTHIAZIDE) 1 by mouth daily.  #90 x 0   Entered by:   Army Fossa CMA   Authorized by:   Loreen Freud DO   Signed by:   Army Fossa CMA on 11/01/2009   Method used:   Electronically to        [facility] [address] (retail)       [phone]       [place], [place]  [address]       Ph: [phone]       Fax: [phone]   RxID:   [id number]

## 2010-09-27 NOTE — Progress Notes (Signed)
Summary: refill  Phone Note Refill Request Message from:  Fax from Pharmacy on [address] fax [phone]  Refills Requested: Medication #1:  LIPITOR 20 MG TABS Take 1 tablet by mouth once a day**LABS DUE NOW** Initial call taken by: Barb Merino,  October 07, 2009 4:34 PM    Prescriptions: LIPITOR 20 MG TABS (ATORVASTATIN CALCIUM) Take 1 tablet by mouth once a day**LABS DUE NOW**  #30 Tablet x 0   Entered by:   Army Fossa CMA   Authorized by:   Loreen Freud DO   Signed by:   Army Fossa CMA on 10/07/2009   Method used:   [facility]        [facility] [phone]* (retail)       [address]       [place], [place]  [address]       Ph: [phone]       Fax: [phone]   RxID:   [id number]

## 2010-09-27 NOTE — Progress Notes (Signed)
Summary: change BP med  Phone Note Call from Patient Call back at [phone]   Caller: Patient Summary of Call: Pt states that BP has been averaging 130/90 throughout the day but seem to drop to 120/80 in evening. Pt would like to know if there is another med that he can take beside the lisinopril that will help to maintain his BP at 120/80 throughout the day.Marland KitchenMarland KitchenPls advise..............Marland KitchenFelecia Deloach CMA  June 07, 2010 10:50 AM   Follow-up for Phone Call        He is taking 2 a day---- he can try taking one in am and 1 in pm   Follow-up by: Loreen Freud DO,  June 07, 2010 11:07 AM  Additional Follow-up for Phone Call Additional follow up Details #1::        Discuss with patient will continue to monitor BP and check back in about a month to let us know how BP is running...........Marland KitchenFelecia Deloach CMA  June 07, 2010 11:47 AM

## 2010-09-27 NOTE — Progress Notes (Signed)
Summary: prior author-denied for Caduet/  Phone Note Refill Request Call back at [phone] Message from:  Fax from Pharmacy on November 02, 2009 4:36 PM  Refills Requested: Medication #1:  CADUET 10-20 MG TABS Take one tablet daily [facility] fax [phone] prior author fax [phone]   Method Requested: Fax to Local Pharmacy Next Appointment Scheduled: no appt Initial call taken by: Barb Merino,  November 02, 2009 4:38 PM  Follow-up for Phone Call        prior auth initiated waiting for form to be faxed to our office.Marland KitchenMarland KitchenMarland KitchenDoristine Devoid  November 03, 2009 9:13 AM   information faxed back to [facility] awaiting approval.......Marland KitchenDoristine Devoid  November 12, 2009 11:33 AM    Additional Follow-up for Phone Call Additional follow up Details #1::        Prir auth DENIED for Caduet this decision is based  on the members certificate of coverage specific limitations and exclusions for coverage.   Formulary alternatives are: Simvastatin and Amlodipine.   Please advise Additional Follow-up by: Kandice Hams,  November 16, 2009 8:55 AM    Additional Follow-up for Phone Call Additional follow up Details #2::    let pt know and we can call in meds separately0---  lipitor 20mg  and norvasc 10 mg  Follow-up by: Loreen Freud DO,  November 16, 2009 9:50 AM  Additional Follow-up for Phone Call Additional follow up Details #3:: Details for Additional Follow-up Action Taken: left message to call office................Marland KitchenFelecia Deloach CMA  November 16, 2009 10:21 AM  left message to call  office..............Marland KitchenFelecia Deloach CMA  November 17, 2009 9:10 AM  pt return call left message to call office...............Marland KitchenFelecia Deloach CMA  November 17, 2009 2:41 PM   pt aware, rx sent to pharmacy..........Marland KitchenFelecia Deloach CMA  November 17, 2009 2:55 PM   New/Updated Medications: LIPITOR 20 MG TABS (ATORVASTATIN CALCIUM) take 1 tab once daily NORVASC 10 MG TABS (AMLODIPINE BESYLATE) take 1 tab once daily Prescriptions: NORVASC 10 MG  TABS (AMLODIPINE BESYLATE) take 1 tab once daily  #90 x 0   Entered by:   Jeremy Johann CMA   Authorized by:   Loreen Freud DO   Signed by:   Jeremy Johann CMA on 11/17/2009   Method used:   Faxed to ...       [facility] [address] (retail)       [address]       [place], [place]  [address]       Ph: [phone]       Fax: [phone]   RxID:   [id number] LIPITOR 20 MG TABS (ATORVASTATIN CALCIUM) take 1 tab once daily  #90 x 0   Entered by:   Jeremy Johann CMA   Authorized by:   Loreen Freud DO   Signed by:   Jeremy Johann CMA on 11/17/2009   Method used:   Faxed to ...       [facility] [address] (retail)       [address]       [place], [place]  [address]       Ph: [phone]       Fax: [phone]   RxID:   [id number]    Preventive Care Screening  Colonoscopy:    Date:  06/18/1998    Next Due:  06/2003    Results:  normal  Last  Tetanus Booster:    Date:  09/27/2000    Results:  Historical     Immunization History:  Tetanus/Td Immunization History:    Tetanus/Td:  historical (09/27/2000)

## 2010-09-27 NOTE — Progress Notes (Signed)
Summary: med change (lmom 1/10)  Phone Note Call from Patient   Summary of Call: Pt called and left a voicemail stating that his insurnace company does not cover would like to know if you would switch him to Lisinopril or Benazpril. Dosage and instructions?  Initial call taken by: Army Fossa CMA,  September 06, 2009 12:46 PM  Follow-up for Phone Call        lisinopril hct 20/25 1 by mouth once daily  #30  ----needs ov 2-3 weeks to check bp Follow-up by: Loreen Freud DO,  September 06, 2009 12:49 PM  Additional Follow-up for Phone Call Additional follow up Details #1::        LMTCB. Army Fossa CMA  September 06, 2009 2:58 PM    New/Updated Medications: LISINOPRIL-HYDROCHLOROTHIAZIDE 20-25 MG TABS (LISINOPRIL-HYDROCHLOROTHIAZIDE) 1 by mouth daily. Prescriptions: LISINOPRIL-HYDROCHLOROTHIAZIDE 20-25 MG TABS (LISINOPRIL-HYDROCHLOROTHIAZIDE) 1 by mouth daily.  #30 x 2   Entered by:   Army Fossa CMA   Authorized by:   Loreen Freud DO   Signed by:   Army Fossa CMA on 09/06/2009   Method used:   [facility] to        [facility] [phone][facility] (retail)       [address]       [place], [place]  [address]       Ph: [phone]       Fax: [phone]   RxID:   [id number]

## 2010-09-27 NOTE — Progress Notes (Signed)
Summary: norvasc,allopurinol,lipitor,lisinopril/hctz refill   Phone Note Refill Request Call back at [phone] Message from:  Pharmacy on February 23, 2010 10:30 AM  Refills Requested: Medication #1:  NORVASC 10 MG TABS take 1 tab once daily.   Dosage confirmed as above?Dosage Confirmed   Supply Requested: 3 months   Last Refilled: 11/17/2009  Medication #2:  ALLOPURINOL 300 MG TABS Take one tablet daily   Dosage confirmed as above?Dosage Confirmed   Supply Requested: 3 months   Last Refilled: 11/14/2009  Medication #3:  LIPITOR 20 MG TABS take 1 tab once daily   Dosage confirmed as above?Dosage Confirmed   Supply Requested: 3 months   Last Refilled: 01/24/2010  Medication #4:  LISINOPRIL-HYDROCHLOROTHIAZIDE 20-25 MG TABS 1 by mouth daily.   Dosage confirmed as above?Dosage Confirmed   Supply Requested: 3 months   Last Refilled: 11/22/2009 [facility] [address]  Next Appointment Scheduled: NONE Initial call taken by: Lavell Islam,  February 23, 2010 10:35 AM    Prescriptions: NORVASC 10 MG TABS (AMLODIPINE BESYLATE) take 1 tab once daily  #90 x 1   Entered by:   Doristine Devoid   Authorized by:   Neena Rhymes MD   Signed by:   Doristine Devoid on 02/23/2010   Method used:   Electronically to        [facility] [address] (retail)       [address]       [place], [place]  [address]       Ph: [phone]       Fax: [phone]   RxID:   [id number] LISINOPRIL-HYDROCHLOROTHIAZIDE 20-25 MG TABS (LISINOPRIL-HYDROCHLOROTHIAZIDE) 1 by mouth daily.  #90 Tablet x 1   Entered by:   Doristine Devoid   Authorized by:   Neena Rhymes MD   Signed by:   Doristine Devoid on 02/23/2010   Method used:   Electronically to        [facility] [address] (retail)       [address]       [place], [place]  [address]       Ph: [phone]       Fax: [phone]   RxID:   [id number] ALLOPURINOL 300  MG TABS (ALLOPURINOL) Take one tablet daily  #90 x 1   Entered by:   Doristine Devoid   Authorized by:   Neena Rhymes MD   Signed by:   Doristine Devoid on 02/23/2010   Method used:   Electronically to        [facility] [address] (retail)       [address]       [place], [place]  [address]       Ph: [phone]       Fax: [phone]   RxID:   [id number] LIPITOR 20 MG TABS (ATORVASTATIN CALCIUM) take 1 tab once daily  #90 x 1   Entered by:   Doristine Devoid   Authorized by:   Neena Rhymes MD   Signed by:   Doristine Devoid on 02/23/2010   Method used:   Electronically to        [facility] [address] (retail)       [address]       [place], [place]  [address]       Ph: [phone]       Fax: [phone]   RxID:   [id number]

## 2010-09-29 NOTE — Progress Notes (Signed)
Summary: BP Issues  Phone Note Call from Patient Call back at [phone]   Caller: Patient Summary of Call: Patient called and Lm on triage VM stating that he has been taking his Lisinopril 1 tab in the morning and 1 in the evening. He has been checking  his BP in the office and the average has been 130/90 and at home the average has been 120/80. He is coming in March for his CPX but didn't know if you wanted to wait till then to address this. Please advise if he should change his med or what to do.  Initial call taken by: Harold Barban,  September 19, 2010 1:20 PM  Follow-up for Phone Call        is the bp cuff automatic or manual?----He probably needs med added.  He would need ov to add med---if he has automatic bp cuff bring that as well.   Follow-up by: Loreen Freud DO,  September 19, 2010 1:38 PM  Additional Follow-up for Phone Call Additional follow up Details #1::        Left message to call back... Almeta Monas CMA Duncan Dull)  September 19, 2010 5:07 PM      Additional Follow-up for Phone Call Additional follow up Details #2::    Pt aware OV scheduled, Pt will bring BP cuff to appt........Marland KitchenFelecia Deloach CMA  September 20, 2010 10:13 AM

## 2010-09-29 NOTE — Assessment & Plan Note (Signed)
Summary: discuss BP//fd   Vital Signs:  Patient profile:   55 year old male Height:      67 inches Weight:      149.6 pounds BMI:     23.52 Pulse rate:   56 / minute Pulse rhythm:   regular BP sitting:   116 / 72  (right arm) Cuff size:   regular  Vitals Entered By: Almeta Monas CMA Duncan Dull) (September 23, 2010 8:20 AM) CC: BP check---Needs labs//pt fasting--lipid, hep 272.4 Comments BP check with patient cuff is 107/76 pulse 57   History of Present Illness:  Hypertension follow-up      This is a 55 year old man who presents for Hypertension follow-up.  BP low in am and then increases when he gets to work.  No cp, ha, sob etc.  The patient denies lightheadedness, urinary frequency, headaches, edema, impotence, rash, and fatigue.  The patient denies the following associated symptoms: chest pain, chest pressure, exercise intolerance, dyspnea, palpitations, syncope, leg edema, and pedal edema.  Compliance with medications (by patient report) has been near 100%.  The patient reports that dietary compliance has been good.  The patient reports exercising 3-4X per week.  Adjunctive measures currently used by the patient include salt restriction.  Pt is taking lisinopril two times a day.  Current Medications (verified): 1)  Lipitor 20 Mg Tabs (Atorvastatin Calcium) .... Take 1 Tablet By Mouth Once A Day. *labs Are Due Now_call For Appt* 2)  Allopurinol 300 Mg Tabs (Allopurinol) .Marland Kitchen.. 1 Tablet By Mouth Once A Day 3)  Proair Hfa 108 (90 Base) Mcg/act Aers (Albuterol Sulfate) .... 2 Puffs Qid As Needed 4)  Lisinopril-Hydrochlorothiazide 20-12.5 Mg Tabs (Lisinopril-Hydrochlorothiazide) .... 2 By Mouth Once Daily 5)  Clotrimazole-Betamethasone 1-0.05 % Crea (Clotrimazole-Betamethasone) .... Apply Two Times A Day  Allergies (verified): No Known Drug Allergies  Past History:  Past Medical History: Last updated: 01/02/2007 Gout Hyperlipidemia Hypertension Colonic polyps, hx of  Past Surgical  History: Last updated: 10/22/2009 Denies surgical history Inguinal herniorrhaphy  B/L --2010  Family History: Last updated: 10/22/2009 HTN CVA Family History High cholesterol Family History of Arthritis  Social History: Last updated: 10/17/2007 Occupation: Dentist Married Never Smoked Alcohol use-yes: socially Drug use-no Regular exercise-yes: very active  Risk Factors: Alcohol Use: <1 (10/22/2009) Caffeine Use: 0 (10/22/2009) Exercise: yes (10/22/2009)  Risk Factors: Smoking Status: never (10/22/2009) Passive Smoke Exposure: no (10/22/2009)  Family History: Reviewed history from 10/22/2009 and no changes required. HTN CVA Family History High cholesterol Family History of Arthritis  Social History: Reviewed history from 10/17/2007 and no changes required. Occupation: Education officer, community Married Never Smoked Alcohol use-yes: socially Drug use-no Regular exercise-yes: very active  Review of Systems      See HPI  Physical Exam  General:  Well-developed,well-nourished,in no acute distress; alert,appropriate and cooperative throughout examination Lungs:  Normal respiratory effort, chest expands symmetrically. Lungs are clear to auscultation, no crackles or wheezes. Heart:  normal rate and no murmur.   Extremities:  No clubbing, cyanosis, edema, or deformity noted with normal full range of motion of all joints.   Psych:  Cognition and judgment appear intact. Alert and cooperative with normal attention span and concentration. No apparent delusions, illusions, hallucinations   Impression & Recommendations:  Problem # 1:  HYPERTENSION (ICD-401.9) take both lisinopril in am  His updated medication list for this problem includes:    Lisinopril-hydrochlorothiazide 20-12.5 Mg Tabs (Lisinopril-hydrochlorothiazide) .Marland Kitchen... 2 by mouth once daily  Orders: Venipuncture ([address]) TLB-Lipid Panel (80061-LIPID) TLB-BMP (Basic Metabolic  Panel-BMET) (80048-METABOL) TLB-Hepatic/Liver  Function Pnl (80076-HEPATIC) TLB-Uric Acid, Blood (84550-URIC) Specimen Handling ([address])  BP today: 116/72 Prior BP: 110/70 (10/22/2009)  Labs Reviewed: K+: 4.0 (10/22/2009) Creat: : 1.0 (10/22/2009)   Chol: 174 (01/11/2010)   HDL: 69.30 (01/11/2010)   LDL: 87 (01/11/2010)   TG: 89.0 (01/11/2010)  Problem # 2:  HYPERLIPIDEMIA (ICD-272.4)  His updated medication list for this problem includes:    Lipitor 20 Mg Tabs (Atorvastatin calcium) .Marland Kitchen... Take 1 tablet by mouth once a day. *labs are due now_call for appt*  Orders: Venipuncture ([address]) TLB-Lipid Panel (80061-LIPID) TLB-BMP (Basic Metabolic Panel-BMET) (80048-METABOL) TLB-Hepatic/Liver Function Pnl (80076-HEPATIC) TLB-Uric Acid, Blood (84550-URIC) Specimen Handling ([address])  Labs Reviewed: SGOT: 31 (01/11/2010)   SGPT: 30 (01/11/2010)   HDL:69.30 (01/11/2010), 75.30 (10/22/2009)  LDL:87 (01/11/2010), 84 (10/22/2009)  Chol:174 (01/11/2010), 172 (10/22/2009)  Trig:89.0 (01/11/2010), 65.0 (10/22/2009)  Problem # 3:  GOUT (ICD-274.9)  His updated medication list for this problem includes:    Allopurinol 300 Mg Tabs (Allopurinol) .Marland Kitchen... 1 tablet by mouth once a day  Orders: Venipuncture (91478) TLB-Lipid Panel (80061-LIPID) TLB-BMP (Basic Metabolic Panel-BMET) (80048-METABOL) TLB-Hepatic/Liver Function Pnl (80076-HEPATIC) TLB-Uric Acid, Blood (84550-URIC) Specimen Handling ([address])  Elevate extremity; warm compresses, symptomatic relief and medication as directed.   Complete Medication List: 1)  Lipitor 20 Mg Tabs (Atorvastatin calcium) .... Take 1 tablet by mouth once a day. *labs are due now_call for appt* 2)  Allopurinol 300 Mg Tabs (Allopurinol) .Marland Kitchen.. 1 tablet by mouth once a day 3)  Proair Hfa 108 (90 Base) Mcg/act Aers (Albuterol sulfate) .... 2 puffs qid as needed 4)  Lisinopril-hydrochlorothiazide 20-12.5 Mg Tabs (Lisinopril-hydrochlorothiazide) .... 2 by mouth once daily 5)  Clotrimazole-betamethasone 1-0.05 % Crea  (Clotrimazole-betamethasone) .... Apply two times a day  Other Orders: TLB-CBC Platelet - w/Differential (85025-CBCD) TLB-TSH (Thyroid Stimulating Hormone) (84443-TSH) TLB-PSA (Prostate Specific Antigen) (84153-PSA)  Patient Instructions: 1)  rto next month for cpe Prescriptions: ALLOPURINOL 300 MG TABS (ALLOPURINOL) 1 tablet by mouth once a day  #90 x 3   Entered and Authorized by:   Loreen Freud DO   Signed by:   Loreen Freud DO on 09/23/2010   Method used:   [facility]        [facility] [phone][facility] (retail[facility]       [address]       [place], [place]  [address]       Ph: [phone]       Fax: [phone]   RxID:   [id number]    Orders Added: 1)  Venipuncture [[address]] 2)  TLB-Lipid Panel [80061-LIPID] 3)  TLB-BMP (Basic Metabolic Panel-BMET) [80048-METABOL] 4)  TLB-Hepatic/Liver Function Pnl [80076-HEPATIC] 5)  TLB-Uric Acid, Blood [84550-URIC] 6)  TLB-CBC Platelet - w/Differential [85025-CBCD] 7)  TLB-TSH (Thyroid Stimulating Hormone) [84443-TSH] 8)  TLB-PSA (Prostate Specific Antigen) [43329-JJO] 9)  Specimen Handling [99000] 10)  Est. Patient Level III [84166]  Appended Document: discuss BP//fd  Laboratory Results   Urine Tests   Date/Time Reported: September 23, 2010 8:57 AM   Routine Urinalysis   Color: straw Appearance: Clear Glucose: negative   (Normal Range: Negative) Bilirubin: negative   (Normal Range: Negative) Ketone: negative   (Normal Range: Negative) Spec. Gravity: 1.010   (Normal Range: 1.003-1.035) Blood: negative   (Normal Range: Negative) pH: 7.0   (Normal Range: 5.0-8.0) Protein: trace   (Normal Range: Negative) Urobilinogen: negative   (Normal Range: 0-1) Nitrite: negative   (Normal Range: Negative) Leukocyte Esterace:  negative   (Normal Range: Negative)    Comments: Floydene Flock  September 23, 2010 8:58 AM

## 2010-09-29 NOTE — Progress Notes (Signed)
Summary: Refill Requests  Phone Note Refill Request Call back at [phone] Message from:  Pharmacy on September 05, 2010 8:26 AM  Refills Requested: Medication #1:  ALLOPURINOL 300 MG TABS Take one tablet daily   Dosage confirmed as above?Dosage Confirmed   Supply Requested: 3 months   Last Refilled: 08/24/2010  Medication #2:  NORVASC 10 MG TABS take 1 tab once daily.   Dosage confirmed as above?Dosage Confirmed   Brand Name Necessary? No   Supply Requested: 3 months   Last Refilled: 08/24/2010  Medication #3:  LISINOPRIL-HYDROCHLOROTHIAZIDE 20-25 MG TABS 1 by mouth daily.   Dosage confirmed as above?Dosage Confirmed   Supply Requested: 3 months   Last Ref[facility]: 08/24/2010 [facility] [address]   Next Appointment Scheduled: none Initial call taken by: Harold Barban,  September 05, 2010 8:28 AM    Prescriptions: NORVASC 10 MG TABS (AMLODIPINE BESYLATE) take 1 tab once daily  #30 x 0   Entered by:   Doristine Devoid CMA   Authorized by:   Neena Rhymes MD   Signed by:   Doristine Devoid CMA on 09/05/2010   Method used:   Electronically to        [facility] [address] (retail)       [address]       [place], [place]  [address]       Ph: [phone]       Fax: [phone]   RxID:   [id number] LISINOPRIL-HYDROCHLOROTHIAZIDE 20-25 MG TABS (LISINOPRIL-HYDROCHLOROTHIAZIDE) 1 by mouth daily.  #30 x 0   Entered by:   Doristine Devoid CMA   Authorized by:   Neena Rhymes MD   Signed by:   Doristine Devoid CMA on 09/05/2010   Method used:   Electronically to        [facility] [address] (retail)       [address]       [place], [place]  [address]       Ph: [phone]       Fax: [phone]   RxID:   [id number] ALLOPURINOL 300 MG TABS (ALLOPURINOL) Take one tablet daily  #30 x 0   Entered by:   Doristine Devoid CMA   Authorized by:   Neena Rhymes MD   Signed by:   Doristine Devoid  CMA on 09/05/2010   Method used:   Electronically to        [facility] [address] (retail)       [address]       [place], [place]  [address]       Ph: [phone]       Fax: [phone]   RxID:   [id number]

## 2010-10-05 NOTE — Progress Notes (Signed)
Summary: refill  Phone Note Refill Request   Refills Requested: Medication #1:  PROAIR HFA 108 (90 BASE) MCG/ACT AERS 2 puffs qid as needed [facility] [address]..Marland KitchenFelecia Deloach CMA  September 27, 2010 10:57 AM      Prescriptions: PROAIR HFA 108 (90 BASE) MCG/ACT AERS (ALBUTEROL SULFATE) 2 puffs qid as needed  #1 x 5   Entered by:   Jeremy Johann CMA   Authorized by:   Loreen Freud DO   Signed by:   Jeremy Johann CMA on 09/27/2010   Method used:   Faxed to ...       [facility] [phone][facility] (retail)       [address]       [place], [place]  [address]       Ph: [phone]       Fax: [phone]   RxID:   [id number]

## 2010-10-27 ENCOUNTER — Telehealth: Payer: Self-pay | Admitting: Family Medicine

## 2010-10-28 ENCOUNTER — Encounter: Payer: Self-pay | Admitting: Family Medicine

## 2010-10-28 ENCOUNTER — Encounter (INDEPENDENT_AMBULATORY_CARE_PROVIDER_SITE_OTHER): Payer: BC Managed Care – PPO | Admitting: Family Medicine

## 2010-10-28 DIAGNOSIS — Z0000 Encounter for general adult medical examination without abnormal findings: Secondary | ICD-10-CM

## 2010-10-28 DIAGNOSIS — N529 Male erectile dysfunction, unspecified: Secondary | ICD-10-CM

## 2010-10-28 DIAGNOSIS — M109 Gout, unspecified: Secondary | ICD-10-CM

## 2010-10-28 DIAGNOSIS — I10 Essential (primary) hypertension: Secondary | ICD-10-CM

## 2010-10-28 DIAGNOSIS — E785 Hyperlipidemia, unspecified: Secondary | ICD-10-CM

## 2010-10-31 LAB — CONVERTED CEMR LAB
Sex Hormone Binding: 38 nmol/L (ref 13–71)
Testosterone Free: 99.2 pg/mL (ref 47.0–244.0)
Testosterone-% Free: 1.9 % (ref 1.6–2.9)
Testosterone: 513.13 ng/dL (ref 250–890)

## 2010-11-02 ENCOUNTER — Ambulatory Visit: Payer: Self-pay | Admitting: Family Medicine

## 2010-11-02 ENCOUNTER — Encounter: Payer: Self-pay | Admitting: Family Medicine

## 2010-11-02 DIAGNOSIS — E785 Hyperlipidemia, unspecified: Secondary | ICD-10-CM

## 2010-11-02 DIAGNOSIS — I10 Essential (primary) hypertension: Secondary | ICD-10-CM

## 2010-11-02 DIAGNOSIS — Z0000 Encounter for general adult medical examination without abnormal findings: Secondary | ICD-10-CM

## 2010-11-03 LAB — CONVERTED CEMR LAB
ALT: 23 units/L (ref 0–53)
Albumin: 4.9 g/dL (ref 3.5–5.2)
Bilirubin, Direct: 0.1 mg/dL (ref 0.0–0.3)
Cholesterol: 163 mg/dL (ref 0–200)
Eosinophils Absolute: 0.5 10*3/uL (ref 0.0–0.7)
Glucose, Bld: 84 mg/dL (ref 70–99)
Lymphs Abs: 1.7 10*3/uL (ref 0.7–4.0)
MCV: 94.2 fL (ref 78.0–100.0)
Neutrophils Relative %: 61 % (ref 43–77)
PSA: 0.81 ng/mL (ref <=4.00)
Platelets: 185 10*3/uL (ref 150–400)
Potassium: 4 meq/L (ref 3.5–5.3)
Sodium: 138 meq/L (ref 135–145)
TSH: 1.133 microintl units/mL (ref 0.350–4.500)
Total CHOL/HDL Ratio: 3.1
Total Protein: 7.1 g/dL (ref 6.0–8.3)
Triglycerides: 200 mg/dL — ABNORMAL HIGH (ref <150)
VLDL: 40 mg/dL (ref 0–40)
WBC: 6.6 10*3/uL (ref 4.0–10.5)

## 2010-11-03 NOTE — Assessment & Plan Note (Signed)
Summary: cpx/ph   Vital Signs:  Patient profile:   55 year old male Height:      67 inches Weight:      147.2 pounds Pulse rate:   73 / minute Pulse rhythm:   regular BP sitting:   108 / 62  (right arm) Cuff size:   regular  Vitals Entered By: Almeta Monas CMA Duncan Dull) (October 28, 2010 8:23 AM) CC: CPX/Non fasting   History of Present Illness: Pt here for cpe --- labs done already.     Preventive Screening-Counseling & Management  Alcohol-Tobacco     Alcohol drinks/day: <1     Alcohol type: occas--wine     Smoking Status: never     Passive Smoke Exposure: no  Caffeine-Diet-Exercise     Caffeine use/day: 0     Does Patient Exercise: yes     Type of exercise: gym      Exercise (avg: min/session): 30-60     Times/week: 6  Hep-HIV-STD-Contraception     Dental Visit-last 6 months yes     Dental Care Counseling: not indicated; dental care within six months  Safety-Violence-Falls     Seat Belt Use: 100      Sexual History:  currently monogamous and married.        Drug Use:  never.    Problems Prior to Update: 1)  Erectile Dysfunction, Organic  (ICD-607.84) 2)  Skin Rash  (ICD-782.1) 3)  Preventive Health Care  (ICD-V70.0) 4)  Colonic Polyps, Hx of  (ICD-V12.72) 5)  Inguinal Hernia  (ICD-550.90) 6)  Hypertension  (ICD-401.9) 7)  Hyperlipidemia  (ICD-272.4) 8)  Gout  (ICD-274.9)  Medications Prior to Update: 1)  Lipitor 20 Mg Tabs (Atorvastatin Calcium) .... Take 1 Tablet By Mouth Once A Day. *labs Are Due Now_call For Appt* 2)  Allopurinol 300 Mg Tabs (Allopurinol) .Marland Kitchen.. 1 Tablet By Mouth Once A Day 3)  Proair Hfa 108 (90 Base) Mcg/act Aers (Albuterol Sulfate) .... 2 Puffs Qid As Needed 4)  Lisinopril-Hydrochlorothiazide 20-12.5 Mg Tabs (Lisinopril-Hydrochlorothiazide) .... 2 By Mouth Once Daily 5)  Clotrimazole-Betamethasone 1-0.05 % Crea (Clotrimazole-Betamethasone) .... Apply Two Times A Day  Current Medications (verified): 1)  Lipitor 20 Mg Tabs  (Atorvastatin Calcium) .... Take 1 Tablet By Mouth Once A Day. 2)  Allopurinol 300 Mg Tabs (Allopurinol) .Marland Kitchen.. 1 Tablet By Mouth Once A Day 3)  Proair Hfa 108 (90 Base) Mcg/act Aers (Albuterol Sulfate) .... 2 Puffs Qid As Needed 4)  Lisinopril-Hydrochlorothiazide 20-12.5 Mg Tabs (Lisinopril-Hydrochlorothiazide) .... 2 By Mouth Once Daily 5)  Clotrimazole-Betamethasone 1-0.05 % Crea (Clotrimazole-Betamethasone) .... Apply Two Times A Day 6)  Viagra 50 Mg Tabs (Sildenafil Citrate) 7)  Cialis 5 Mg Tabs (Tadalafil) .Marland Kitchen.. 1 By Mouth Once Daily  Allergies (verified): No Known Drug Allergies  Past History:  Past Medical History: Last updated: 01/02/2007 Gout Hyperlipidemia Hypertension Colonic polyps, hx of  Past Surgical History: Last updated: 10/22/2009 Denies surgical history Inguinal herniorrhaphy  B/L --2010  Family History: Last updated: 10/22/2009 HTN CVA Family History High cholesterol Family History of Arthritis  Social History: Last updated: 10/17/2007 Occupation: Dentist Married Never Smoked Alcohol use-yes: socially Drug use-no Regular exercise-yes: very active  Risk Factors: Alcohol Use: <1 (10/28/2010) Caffeine Use: 0 (10/28/2010) Exercise: yes (10/28/2010)  Risk Factors: Smoking Status: never (10/28/2010) Passive Smoke Exposure: no (10/28/2010)  Family History: Reviewed history from 10/22/2009 and no changes required. HTN CVA Family History High cholesterol Family History of Arthritis  Social History: Reviewed history from 10/17/2007 and no  changes required. Occupation: Education officer, community Married Never Smoked Alcohol use-yes: socially Drug use-no Regular exercise-yes: very active  Review of Systems      See HPI General:  Denies chills, fatigue, fever, loss of appetite, malaise, sleep disorder, sweats, weakness, and weight loss. Eyes:  Denies blurring, discharge, double vision, eye irritation, eye pain, halos, itching, light sensitivity, red eye, vision  loss-1 eye, and vision loss-both eyes; optho--q2y. ENT:  Denies decreased hearing, difficulty swallowing, ear discharge, earache, hoarseness, nasal congestion, nosebleeds, postnasal drainage, ringing in ears, sinus pressure, and sore throat. CV:  Denies bluish discoloration of lips or nails, chest pain or discomfort, difficulty breathing at night, difficulty breathing while lying down, fainting, fatigue, leg cramps with exertion, lightheadness, near fainting, palpitations, shortness of breath with exertion, swelling of feet, swelling of hands, and weight gain. Resp:  Denies chest discomfort, chest pain with inspiration, cough, coughing up blood, excessive snoring, hypersomnolence, morning headaches, pleuritic, shortness of breath, sputum productive, and wheezing. GI:  Denies abdominal pain, bloody stools, change in bowel habits, constipation, dark tarry stools, diarrhea, excessive appetite, gas, hemorrhoids, indigestion, loss of appetite, nausea, vomiting, vomiting blood, and yellowish skin color. GU:  Denies decreased libido, discharge, dysuria, erectile dysfunction, genital sores, hematuria, incontinence, nocturia, urinary frequency, and urinary hesitancy. MS:  Denies joint pain, joint redness, joint swelling, loss of strength, low back pain, mid back pain, muscle aches, muscle , cramps, muscle weakness, stiffness, and thoracic pain. Derm:  Denies changes in color of skin, changes in nail beds, dryness, excessive perspiration, flushing, hair loss, insect bite(s), itching, lesion(s), poor wound healing, and rash. Neuro:  Denies brief paralysis, difficulty with concentration, disturbances in coordination, falling down, headaches, inability to speak, memory loss, numbness, poor balance, seizures, sensation of room spinning, tingling, tremors, visual disturbances, and weakness. Psych:  Denies alternate hallucination ( auditory/visual), anxiety, depression, easily angered, easily tearful, irritability, mental  problems, panic attacks, sense of great danger, suicidal thoughts/plans, thoughts of violence, unusual visions or sounds, and thoughts /plans of harming others. Endo:  Denies cold intolerance, excessive hunger, excessive thirst, excessive urination, heat intolerance, polyuria, and weight change. Heme:  Denies abnormal bruising, bleeding, enlarge lymph nodes, fevers, pallor, and skin discoloration. Allergy:  Denies hives or rash, itching eyes, persistent infections, seasonal allergies, and sneezing.  Physical Exam  General:  Well-developed,well-nourished,in no acute distress; alert,appropriate and cooperative throughout examination Head:  Normocephalic and atraumatic without obvious abnormalities. No apparent alopecia or balding. Eyes:  pupils equal, pupils round, pupils reactive to light, and no injection.   Ears:  External ear exam shows no significant lesions or deformities.  Otoscopic examination reveals clear canals, tympanic membranes are intact bilaterally without bulging, retraction, inflammation or discharge. Hearing is grossly normal bilaterally. Nose:  External nasal examination shows no deformity or inflammation. Nasal mucosa are pink and moist without lesions or exudates. Mouth:  Oral mucosa and oropharynx without lesions or exudates.  Teeth in good repair. Neck:  No deformities, masses, or tenderness noted. Chest Wall:  No deformities, masses, tenderness or gynecomastia noted. Lungs:  Normal respiratory effort, chest expands symmetrically. Lungs are clear to auscultation, no crackles or wheezes. Heart:  Normal rate and regular rhythm. S1 and S2 normal without gallop, murmur, click, rub or other extra sounds. Abdomen:  Bowel sounds positive,abdomen soft and non-tender without masses, organomegaly or hernias noted. Rectal:  No external abnormalities noted. Normal sphincter tone. No rectal masses or tenderness. Genitalia:  Testes bilaterally descended without nodularity, tenderness or  masses. No scrotal masses or lesions. No  penis lesions or urethral discharge. Prostate:  Prostate gland firm and smooth, no enlargement, nodularity, tenderness, mass, asymmetry or induration. Msk:  normal ROM, no joint tenderness, no joint swelling, no joint warmth, no redness over joints, no joint deformities, no joint instability, no crepitation, and no muscle atrophy.   Pulses:  R and L carotid,radial,femoral,dorsalis pedis and posterior tibial pulses are full and equal bilaterally Extremities:  No clubbing, cyanosis, edema, or deformity noted with normal full range of motion of all joints.   Neurologic:  No cranial nerve deficits noted. Station and gait are normal. Plantar reflexes are down-going bilaterally. DTRs are symmetrical throughout. Sensory, motor and coordinative functions appear intact. Skin:  Intact without suspicious lesions or rashes Cervical Nodes:  No lymphadenopathy noted Axillary Nodes:  No palpable lymphadenopathy Psych:  Cognition and judgment appear intact. Alert and cooperative with normal attention span and concentration. No apparent delusions, illusions, hallucinations   Impression & Recommendations:  Problem # 1:  PREVENTIVE HEALTH CARE (ICD-V70.0)  Orders: Venipuncture (16109) EKG w/ Interpretation (93000) EKG w/ Interpretation (93000)  Reviewed preventive care protocols, scheduled due services, and updated immunizations.  Problem # 2:  ERECTILE DYSFUNCTION, ORGANIC (ICD-607.84)  His updated medication list for this problem includes:    Viagra 50 Mg Tabs (Sildenafil citrate)    Cialis 5 Mg Tabs (Tadalafil) .Marland Kitchen... 1 by mouth once daily  Orders: Venipuncture (60454) T- * Misc. Laboratory test [phone]) Specimen Handling (91478) EKG w/ Interpretation (93000)  Discussed proper use of medications, as well as side effects.   Problem # 3:  HYPERTENSION (ICD-401.9)  His updated medication list for this problem includes:    Lisinopril-hydrochlorothiazide  20-12.5 Mg Tabs (Lisinopril-hydrochlorothiazide) .Marland Kitchen... 2 by mouth once daily  Orders: Venipuncture (29562) EKG w/ Interpretation (93000)  BP today: 108/62 Prior BP: 116/72 (09/23/2010)  Labs Reviewed: K+: 4.1 (09/23/2010) Creat: : 0.9 (09/23/2010)   Chol: 164 (09/23/2010)   HDL: 60.70 (09/23/2010)   LDL: 94 (09/23/2010)   TG: 47.0 (09/23/2010)  Problem # 4:  HYPERLIPIDEMIA (ICD-272.4)  His updated medication list for this problem includes:    Lipitor 20 Mg Tabs (Atorvastatin calcium) .Marland Kitchen... Take 1 tablet by mouth once a day.  Orders: Venipuncture (13086) EKG w/ Interpretation (93000)  Labs Reviewed: SGOT: 26 (09/23/2010)   SGPT: 21 (09/23/2010)   HDL:60.70 (09/23/2010), 69.30 (01/11/2010)  LDL:94 (09/23/2010), 87 (01/11/2010)  Chol:164 (09/23/2010), 174 (01/11/2010)  Trig:47.0 (09/23/2010), 89.0 (01/11/2010)  Problem # 5:  GOUT (ICD-274.9)  His updated medication list for this problem includes:    Allopurinol 300 Mg Tabs (Allopurinol) .Marland Kitchen... 1 tablet by mouth once a day  Orders: Venipuncture (57846) EKG w/ Interpretation (93000)  Elevate extremity; warm compresses, symptomatic relief and medication as directed.   Complete Medication List: 1)  Lipitor 20 Mg Tabs (Atorvastatin calcium) .... Take 1 tablet by mouth once a day. 2)  Allopurinol 300 Mg Tabs (Allopurinol) .Marland Kitchen.. 1 tablet by mouth once a day 3)  Proair Hfa 108 (90 Base) Mcg/act Aers (Albuterol sulfate) .... 2 puffs qid as needed 4)  Lisinopril-hydrochlorothiazide 20-12.5 Mg Tabs (Lisinopril-hydrochlorothiazide) .... 2 by mouth once daily 5)  Clotrimazole-betamethasone 1-0.05 % Crea (Clotrimazole-betamethasone) .... Apply two times a day 6)  Viagra 50 Mg Tabs (Sildenafil citrate) 7)  Cialis 5 Mg Tabs (Tadalafil) .Marland Kitchen.. 1 by mouth once daily   Orders Added: 1)  Venipuncture [36415] 2)  Venipuncture [96295] 3)  T- * Misc. Laboratory test [99999] 4)  Specimen Handling [99000] 5)  Est. Patient 40-64 years [99396] 6)  EKG w/ Interpretation [93000] 7)  EKG w/ Interpretation [93000]     EKG  Procedure date:  10/28/2010  Findings:      Normal sinus rhythm with rate of:  73   Last Flu Vaccine:  Fluvax 3+ (06/10/2008 1:58:37 PM) Flu Vaccine Result Date:  06/08/2010 Flu Vaccine Result:  given Flu Vaccine Next Due:  1 yr Last PSA Result:  0.81 (09/23/2010 8:38:55 AM) PSA Result Date:  09/23/2010 PSA Result:  0.81 PSA Next Due:  1 yr

## 2010-11-04 DIAGNOSIS — Z0000 Encounter for general adult medical examination without abnormal findings: Secondary | ICD-10-CM

## 2010-11-08 NOTE — Assessment & Plan Note (Signed)
Summary: CPX   Vital Signs:  Patient profile:   55 year old male Height:      66.50 inches (168.91 cm) Weight:      155.13 pounds (70.51 kg) BMI:     24.75 Temp:     97.4 degrees F (36.33 degrees C) oral BP sitting:   90 / 66  (left arm) Cuff size:   regular  Vitals Entered By: Lucious Groves CMA (November 02, 2010 3:00 PM) CC: CPX./kb Is Patient Diabetic? No Pain Assessment Patient in pain? no        History of Present Illness: 55 yo man here today for CPE.    due for colonoscopy, for insurance reasons wants to go to [place].  1) HTN- pt is content w/ lower BP b/c when doing surgery knows that BP will spike.  no CP, SOB, HAs, visual changes, edema.  2) Hyperlipidemia- on Lipitor nightly.  no abd pain, N/V, myalgias  Preventive Screening-Counseling & Management  Alcohol-Tobacco     Alcohol drinks/day: <1     Smoking Status: never  Caffeine-Diet-Exercise     Does Patient Exercise: yes      Sexual History:  currently monogamous.        Drug Use:  never.    Current Medications (verified): 1)  Viagra 100 Mg Tabs (Sildenafil Citrate) .... As Needed 2)  Lipitor 20 Mg Tabs (Atorvastatin Calcium) .... Take 1 Tab Once Daily- Cpx and Labs Due 3)  Allopurinol 300 Mg Tabs (Allopurinol) .... Take One Tablet Daily 4)  Lisinopril-Hydrochlorothiazide 20-25 Mg Tabs (Lisinopril-Hydrochlorothiazide) .Marland Kitchen.. 1 By Mouth Daily. 5)  Norvasc 10 Mg Tabs (Amlodipine Besylate) .... Take 1 Tab Once Daily  Allergies (verified): No Known Drug Allergies  Past History:  Past medical, surgical, family and social histories (including risk factors) reviewed, and no changes noted (except as noted below).  Past Medical History: Reviewed history from 06/01/2008 and no changes required. Gout Hyperlipidemia Hypertension Erectile Dysfxn  Past Surgical History: Reviewed history from 06/14/2007 and no changes required. Inguinal herniorrhaphy  Family History: Reviewed history from 07/02/2009 and no  changes required. Family History Hypertension Twin brother Dad passed away at age 37- (last spring) hx ESRD, GI bleed Mom- 5 living HTN  Social History: Reviewed history from 07/02/2009 and no changes required. 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    Physical Exam  General:  Well-developed,well-nourished,in no acute distress; alert,appropriate and cooperative throughout examination Head:  Normocephalic and atraumatic without obvious abnormalities. No apparent alopecia or balding. Eyes:  No corneal or conjunctival inflammation noted. EOMI. Perrla. Funduscopic exam benign, without hemorrhages, exudates or papilledema. Vision grossly normal. Ears:  External ear exam shows no significant lesions or deformities.  Otoscopic examination reveals clear canals, tympanic membranes are intact bilaterally without bulging, retraction, inflammation or discharge. Hearing is grossly normal bilaterally. Nose:  External nasal examination shows no deformity or inflammation. Nasal mucosa are pink and moist without lesions or exudates. Mouth:  Oral mucosa and oropharynx without lesions or exudates.  Teeth in good repair. Neck:  No deformities, masses, or tenderness noted. Lungs:  Normal respiratory effort, chest expands symmetrically.  Lungs are clear to auscultation, no crackles or wheezes. Heart:  Normal rate and regular rhythm. S1 and S2 normal without gallop, murmur, click, rub or other extra  sounds. Abdomen:  Bowel sounds positive,abdomen soft and non-tender without masses, organomegaly or hernias noted. Rectal:  No external abnormalities noted. Normal sphincter tone. No rectal masses or tenderness. Genitalia:  Testes bilaterally descended without nodularity, tenderness or masses. No scrotal masses or lesions. No penis lesions or urethral discharge. Prostate:  Prostate gland firm and smooth, no enlargement, nodularity, tenderness, mass, asymmetry or induration. Pulses:  +2 Carotids, radials, DPs Extremities:  No clubbing, cyanosis, edema, or deformity noted with normal full range of motion of all joints.   Neurologic:  No cranial nerve deficits noted. Station and gait are normal. Plantar reflexes are down-going bilaterally. DTRs are symmetrical throughout. Sensory, motor and coordinative functions appear intact. Skin:  Intact without suspicious lesions or rashes Cervical Nodes:  No lymphadenopathy noted Inguinal Nodes:  No significant adenopathy Psych:  Cognition and judgment appear intact. Alert and cooperative with normal attention span and concentration. No apparent delusions, illusions, hallucinations   Impression & Recommendations:  Problem # 1:  PREVENTIVE HEALTH CARE (ICD-V70.0) Assessment Unchanged pt's PE WNL.  check labs.  EKG done as baseline- WNL.  anticipatory guidance provided. Orders: Specimen Handling (91478) Specimen Handling (29562) Gastroenterology Referral (GI)  Complete Medication List: 1)  Viagra 100 Mg Tabs (Sildenafil citrate) .... As needed 2)  Lipitor 20 Mg Tabs (Atorvastatin calcium) .... Take 1 tab once daily 3)  Allopurinol 300 Mg Tabs (Allopurinol) .... Take one tablet daily 4)  Lisinopril-hydrochlorothiazide 20-25 Mg Tabs (Lisinopril-hydrochlorothiazide) .Marland Kitchen.. 1 by mouth daily. 5)  Norvasc 10 Mg Tabs (Amlodipine besylate) .... Take 1 tab once daily  Other Orders: Venipuncture (13086)  Patient Instructions: 1)  Follow up in 6 months to  recheck BP and cholesterol 2)  We'll notify you of your lab results 3)  Your exam looks great!  Keep up the good work! 4)  Happy Spring! Prescriptions: NORVASC 10 MG TABS (AMLODIPINE BESYLATE) take 1 tab once daily  #90 x 3   Entered and Authorized by:   Neena Rhymes MD   Signed by:   Neena Rhymes MD on 11/02/2010   Method used:   Electronically to        [facility] [address] (retail)       [address]       [place], [place]  [address]       Ph: [phone]       Fax: [phone]   RxID:   [id number] LISINOPRIL-HYDROCHLOROTHIAZIDE 20-25 MG TABS (LISINOPRIL-HYDROCHLOROTHIAZIDE) 1 by mouth daily.  #90 x 3   Entered and Authorized by:   Neena Rhymes MD   Signed by:   Neena Rhymes MD on 11/02/2010   Method used:   Electronically to        [facility] [address] (retail)       [address]       [place], [place]  [address]       Ph: [phone]       Fax: [phone]   RxID:   [id number] LIPITOR 20 MG TABS (ATORVASTATIN CALCIUM) take 1 tab once daily  #90 x 3   Entered and Authorized by:   Neena Rhymes MD   Signed by:   Neena Rhymes MD on 11/02/2010  Method used:   Electronically to        [facility] [address] (retail)       [address]       [place], [place]  [address]       Ph: [phone]       Fax: [phone]   RxID:   [id number] LIPITOR 20 MG TABS (ATORVASTATIN CALCIUM) take 1 tab once daily  #30 x 6   Entered and Authorized by:   Neena Rhymes MD   Signed by:   Neena Rhymes MD on 11/02/2010   Method used:   Electronically to        [facility] [address] (retail)       [address]       [place], [place]  [address]       Ph: [phone]       Fax: [phone]   RxID:   [id number] NORVASC 10 MG TABS (AMLODIPINE BESYLATE) take 1 tab once daily  #30 Tablet x  6   Entered and Authorized by:   Neena Rhymes MD   Signed by:   Neena Rhymes MD on 11/02/2010   Method used:   Electronically to        [facility] [address] (retail)       [address]       [place], [place]  [address]       Ph: [phone]       Fax: [phone]   RxID:   [id number] LISINOPRIL-HYDROCHLOROTHIAZIDE 20-25 MG TABS (LISINOPRIL-HYDROCHLOROTHIAZIDE) 1 by mouth daily.  #30 Tablet x 6   Entered and Authorized by:   Neena Rhymes MD   Signed by:   Neena Rhymes MD on 11/02/2010   Method used:   Electronically to        [facility] [address] (retail)       [address]       [place], [place]  [address]       Ph: [phone]       Fax: [phone]   RxID:   [id number] VIAGRA 100 MG TABS (SILDENAFIL CITRATE) as needed  #4 x 3   Entered and Authorized by:   Neena Rhymes MD   Signed by:   Neena Rhymes MD on 11/02/2010   Method used:   Electronically to        [facility] [address] (retail)       [address]       [place], [place]  [address]       Ph: [phone]       Fax: [phone]   RxID:   [id number]    Orders Added: 1)  Venipuncture [33825] 2)  Specimen Handling [99000] 3)  Specimen Handling [99000] 4)  Est. Patient 40-64 years [99396] 5)  Gastroenterology Referral [GI]  Appended Document: CPX    Clinical Lists Changes  Orders: Added new Service order of EKG w/ Interpretation (93000) - Signed

## 2010-11-08 NOTE — Progress Notes (Signed)
Summary: Outgoing call     ---- Converted from flag ---- ---- 10/27/2010 1:19 PM, Neena Rhymes MD wrote: please call him and let him know that we can't keep refilling meds w/out him scheduling his CPE. ------------------------------ I called to speak with the patient. Man who answered made me aware that the patient is not available, left message with him to have the patient to call back to office. Lucious Groves CMA  October 27, 2010 4:31 PM left message to call office.Felecia Deloach CMA  October 28, 2010 11:42 AM   No return call, called again and left message with lady who answered for the patient to call back to office. Lucious Groves CMA  October 31, 2010 10:44 AM   Per Okey Regal, the patient schedule appt for Wed. There is an issue with his chart and his brothers chart. She will let me know once that is fixed so I can send patient prescriptions. Lucious Groves CMA  October 31, 2010 11:24 AM   Per Beverely Low, flag from [facility]up[facility] Leash states it can be fixed once this not is signed. Closed phone note. Lucious Groves CMA  November 01, 2010 11:08 AM

## 2010-12-21 ENCOUNTER — Other Ambulatory Visit: Payer: Self-pay | Admitting: Family Medicine

## 2011-01-24 ENCOUNTER — Telehealth: Payer: Self-pay | Admitting: *Deleted

## 2011-01-24 NOTE — Telephone Encounter (Signed)
error 

## 2011-03-21 ENCOUNTER — Other Ambulatory Visit: Payer: Self-pay | Admitting: Family Medicine

## 2011-05-03 ENCOUNTER — Other Ambulatory Visit: Payer: Self-pay | Admitting: Family Medicine

## 2011-06-06 ENCOUNTER — Other Ambulatory Visit: Payer: Self-pay | Admitting: Family Medicine

## 2011-06-22 ENCOUNTER — Other Ambulatory Visit: Payer: Self-pay | Admitting: Family Medicine

## 2011-06-22 DIAGNOSIS — E785 Hyperlipidemia, unspecified: Secondary | ICD-10-CM

## 2011-06-23 ENCOUNTER — Other Ambulatory Visit (INDEPENDENT_AMBULATORY_CARE_PROVIDER_SITE_OTHER): Payer: BC Managed Care – PPO

## 2011-06-23 DIAGNOSIS — E785 Hyperlipidemia, unspecified: Secondary | ICD-10-CM

## 2011-06-23 LAB — HEPATIC FUNCTION PANEL
AST: 29 U/L (ref 0–37)
Albumin: 4.7 g/dL (ref 3.5–5.2)
Alkaline Phosphatase: 46 U/L (ref 39–117)

## 2011-06-23 LAB — LIPID PANEL
Total CHOL/HDL Ratio: 2
Triglycerides: 52 mg/dL (ref 0.0–149.0)

## 2011-06-23 NOTE — Progress Notes (Signed)
Labs only

## 2011-06-27 ENCOUNTER — Encounter: Payer: Self-pay | Admitting: *Deleted

## 2011-07-16 ENCOUNTER — Other Ambulatory Visit: Payer: Self-pay | Admitting: Family Medicine

## 2011-08-25 ENCOUNTER — Other Ambulatory Visit: Payer: Self-pay | Admitting: Family Medicine

## 2011-08-30 ENCOUNTER — Other Ambulatory Visit: Payer: Self-pay

## 2011-08-30 NOTE — Telephone Encounter (Signed)
Please advise 

## 2011-08-31 MED ORDER — TADALAFIL 5 MG PO TABS: 5.0000 mg | ORAL_TABLET | Freq: Every day | ORAL | Status: DC | PRN

## 2011-08-31 NOTE — Telephone Encounter (Signed)
Last seen 10/28/10 for CPE. Please advise      KP

## 2011-09-01 ENCOUNTER — Other Ambulatory Visit: Payer: Self-pay

## 2011-09-01 MED ORDER — TADALAFIL 5 MG PO TABS: 5.0000 mg | ORAL_TABLET | Freq: Every day | ORAL | Status: AC | PRN

## 2011-09-01 NOTE — Telephone Encounter (Signed)
Call from patient and he stated he wanted to send the Cialis to the mail order pharmacy and wanted to see if he could get a higher quantity, because this would be cheaper for him. Please advise   KP

## 2011-09-01 NOTE — Telephone Encounter (Signed)
Im not even sure mail order will pay for cialis----   Try #20

## 2011-09-01 NOTE — Telephone Encounter (Signed)
Mailed to patient per his request    KP

## 2011-11-14 ENCOUNTER — Telehealth: Payer: Self-pay | Admitting: Family Medicine

## 2011-11-14 ENCOUNTER — Other Ambulatory Visit: Payer: Self-pay

## 2011-11-14 ENCOUNTER — Other Ambulatory Visit: Payer: Self-pay | Admitting: Family Medicine

## 2011-11-14 MED ORDER — LISINOPRIL-HYDROCHLOROTHIAZIDE 20-12.5 MG PO TABS: 2.0000 | ORAL_TABLET | Freq: Every day | ORAL | Status: DC

## 2011-11-14 NOTE — Telephone Encounter (Signed)
refil for  LISINO-HCTZ 20-12.5 TAB  Qty 60 Take 2-tablets by mouth every day  Last fill date 2.9.13

## 2011-11-19 ENCOUNTER — Other Ambulatory Visit: Payer: Self-pay | Admitting: Family Medicine

## 2012-01-14 ENCOUNTER — Other Ambulatory Visit: Payer: Self-pay | Admitting: Family Medicine

## 2012-01-19 ENCOUNTER — Ambulatory Visit (INDEPENDENT_AMBULATORY_CARE_PROVIDER_SITE_OTHER): Payer: BC Managed Care – PPO | Admitting: Family Medicine

## 2012-01-19 ENCOUNTER — Encounter: Payer: Self-pay | Admitting: Family Medicine

## 2012-01-19 VITALS — BP 98/72 | HR 59 | Temp 97.8°F | Ht 67.0 in | Wt 145.0 lb

## 2012-01-19 DIAGNOSIS — Z23 Encounter for immunization: Secondary | ICD-10-CM

## 2012-01-19 DIAGNOSIS — Z0000 Encounter for general adult medical examination without abnormal findings: Secondary | ICD-10-CM

## 2012-01-19 DIAGNOSIS — M109 Gout, unspecified: Secondary | ICD-10-CM

## 2012-01-19 DIAGNOSIS — I10 Essential (primary) hypertension: Secondary | ICD-10-CM

## 2012-01-19 LAB — POCT URINALYSIS DIPSTICK
Blood, UA: NEGATIVE
Protein, UA: NEGATIVE
Spec Grav, UA: <=1.005
Urobilinogen, UA: 0.2

## 2012-01-19 MED ORDER — ATORVASTATIN CALCIUM 20 MG PO TABS: ORAL_TABLET | ORAL | Status: DC

## 2012-01-19 MED ORDER — SILDENAFIL CITRATE 100 MG PO TABS: 50.0000 mg | ORAL_TABLET | Freq: Every day | ORAL | Status: DC | PRN

## 2012-01-19 MED ORDER — LISINOPRIL-HYDROCHLOROTHIAZIDE 20-12.5 MG PO TABS: ORAL_TABLET | ORAL | Status: DC

## 2012-01-19 MED ORDER — ALLOPURINOL 300 MG PO TABS: 300.0000 mg | ORAL_TABLET | Freq: Every day | ORAL | Status: DC

## 2012-01-19 NOTE — Progress Notes (Signed)
Addended by: Loreen Freud R on: 01/19/2012 11:00 AM   Modules accepted: Orders

## 2012-01-19 NOTE — Progress Notes (Signed)
Addended by: Candie Echevaria L on: 01/19/2012 11:27 AM   Modules accepted: Orders

## 2012-01-19 NOTE — Patient Instructions (Signed)
Preventive Care for Adults, Male A healthy lifestyle and preventative care can promote health and wellness. Preventative health guidelines for men include the following key practices:  A routine yearly physical is a good way to check with your caregiver about your health and preventative screening. It is a chance to share any concerns and updates on your health, and to receive a thorough exam.   Visit your dentist for a routine exam and preventative care every 6 months. Brush your teeth twice a day and floss once a day. Good oral hygiene prevents tooth decay and gum disease.   The frequency of eye exams is based on your age, health, family medical history, use of contact lenses, and other factors. Follow your caregiver's recommendations for frequency of eye exams.   Eat a healthy diet. Foods like vegetables, fruits, whole grains, low-fat dairy products, and lean protein foods contain the nutrients you need without too many calories. Decrease your intake of foods high in solid fats, added sugars, and salt. Eat the right amount of calories for you.Get information about a proper diet from your caregiver, if necessary.   Regular physical exercise is one of the most important things you can do for your health. Most adults should get at least 150 minutes of moderate-intensity exercise (any activity that increases your heart rate and causes you to sweat) each week. In addition, most adults need muscle-strengthening exercises on 2 or more days a week.   Maintain a healthy weight. The body mass index (BMI) is a screening tool to identify possible weight problems. It provides an estimate of body fat based on height and weight. Your caregiver can help determine your BMI, and can help you achieve or maintain a healthy weight.For adults 20 years and older:   A BMI below 18.5 is considered underweight.   A BMI of 18.5 to 24.9 is normal.   A BMI of 25 to 29.9 is considered overweight.   A BMI of 30 and above  is considered obese.   Maintain normal blood lipids and cholesterol levels by exercising and minimizing your intake of saturated fat. Eat a balanced diet with plenty of fruit and vegetables. Blood tests for lipids and cholesterol should begin at age 20 and be repeated every 5 years. If your lipid or cholesterol levels are high, you are over 50, or you are a high risk for heart disease, you may need your cholesterol levels checked more frequently.Ongoing high lipid and cholesterol levels should be treated with medicines if diet and exercise are not effective.   If you smoke, find out from your caregiver how to quit. If you do not use tobacco, do not start.   If you choose to drink alcohol, do not exceed 2 drinks per day. One drink is considered to be 12 ounces (355 mL) of beer, 5 ounces (148 mL) of wine, or 1.5 ounces (44 mL) of liquor.   Avoid use of street drugs. Do not share needles with anyone. Ask for help if you need support or instructions about stopping the use of drugs.   High blood pressure causes heart disease and increases the risk of stroke. Your blood pressure should be checked at least every 1 to 2 years. Ongoing high blood pressure should be treated with medicines, if weight loss and exercise are not effective.   If you are 45 to 57 years old, ask your caregiver if you should take aspirin to prevent heart disease.   Diabetes screening involves taking a blood   sample to check your fasting blood sugar level. This should be done once every 3 years, after age 45, if you are within normal weight and without risk factors for diabetes. Testing should be considered at a younger age or be carried out more frequently if you are overweight and have at least 1 risk factor for diabetes.   Colorectal cancer can be detected and often prevented. Most routine colorectal cancer screening begins at the age of 50 and continues through age 75. However, your caregiver may recommend screening at an earlier  age if you have risk factors for colon cancer. On a yearly basis, your caregiver may provide home test kits to check for hidden blood in the stool. Use of a small camera at the end of a tube, to directly examine the colon (sigmoidoscopy or colonoscopy), can detect the earliest forms of colorectal cancer. Talk to your caregiver about this at age 50, when routine screening begins. Direct examination of the colon should be repeated every 5 to 10 years through age 75, unless early forms of pre-cancerous polyps or small growths are found.   Hepatitis C blood testing is recommended for all people born from 1945 through 1965 and any individual with known risks for hepatitis C.   Practice safe sex. Use condoms and avoid high-risk sexual practices to reduce the spread of sexually transmitted infections (STIs). STIs include gonorrhea, chlamydia, syphilis, trichomonas, herpes, HPV, and human immunodeficiency virus (HIV). Herpes, HIV, and HPV are viral illnesses that have no cure. They can result in disability, cancer, and death.   A one-time screening for abdominal aortic aneurysm (AAA) and surgical repair of large AAAs by sound wave imaging (ultrasonography) is recommended for ages 65 to 75 years who are current or former smokers.   Healthy men should no longer receive prostate-specific antigen (PSA) blood tests as part of routine cancer screening. Consult with your caregiver about prostate cancer screening.   Testicular cancer screening is not recommended for adult males who have no symptoms. Screening includes self-exam, caregiver exam, and other screening tests. Consult with your caregiver about any symptoms you have or any concerns you have about testicular cancer.   Use sunscreen with skin protection factor (SPF) of 30 or more. Apply sunscreen liberally and repeatedly throughout the day. You should seek shade when your shadow is shorter than you. Protect yourself by wearing long sleeves, pants, a  wide-brimmed hat, and sunglasses year round, whenever you are outdoors.   Once a month, do a whole body skin exam, using a mirror to look at the skin on your back. Notify your caregiver of new moles, moles that have irregular borders, moles that are larger than a pencil eraser, or moles that have changed in shape or color.   Stay current with required immunizations.   Influenza. You need a dose every fall (or winter). The composition of the flu vaccine changes each year, so being vaccinated once is not enough.   Pneumococcal polysaccharide. You need 1 to 2 doses if you smoke cigarettes or if you have certain chronic medical conditions. You need 1 dose at age 65 (or older) if you have never been vaccinated.   Tetanus, diphtheria, pertussis (Tdap, Td). Get 1 dose of Tdap vaccine if you are younger than age 65 years, are over 65 and have contact with an infant, are a healthcare worker, or simply want to be protected from whooping cough. After that, you need a Td booster dose every 10 years. Consult your caregiver if   you have not had at least 3 tetanus and diphtheria-containing shots sometime in your life or have a deep or dirty wound.   HPV. This vaccine is recommended for males 13 through 57 years of age. This vaccine may be given to men 22 through 57 years of age who have not completed the 3 dose series. It is recommended for men through age 26 who have sex with men or whose immune system is weakened because of HIV infection, other illness, or medications. The vaccine is given in 3 doses over 6 months.   Measles, mumps, rubella (MMR). You need at least 1 dose of MMR if you were born in 1957 or later. You may also need a 2nd dose.   Meningococcal. If you are age 19 to 21 years and a first-year college student living in a residence hall, or have one of several medical conditions, you need to get vaccinated against meningococcal disease. You may also need additional booster doses.   Zoster (shingles).  If you are age 60 years or older, you should get this vaccine.   Varicella (chickenpox). If you have never had chickenpox or you were vaccinated but received only 1 dose, talk to your caregiver to find out if you need this vaccine.   Hepatitis A. You need this vaccine if you have a specific risk factor for hepatitis A virus infection, or you simply wish to be protected from this disease. The vaccine is usually given as 2 doses, 6 to 18 months apart.   Hepatitis B. You need this vaccine if you have a specific risk factor for hepatitis B virus infection or you simply wish to be protected from this disease. The vaccine is given in 3 doses, usually over 6 months.  Preventative Service / Frequency Ages 19 to 39  Blood pressure check.** / Every 1 to 2 years.   Lipid and cholesterol check.** / Every 5 years beginning at age 20.   Hepatitis C blood test.** / For any individual with known risks for hepatitis C.   Skin self-exam. / Monthly.   Influenza immunization.** / Every year.   Pneumococcal polysaccharide immunization.** / 1 to 2 doses if you smoke cigarettes or if you have certain chronic medical conditions.   Tetanus, diphtheria, pertussis (Tdap,Td) immunization. / A one-time dose of Tdap vaccine. After that, you need a Td booster dose every 10 years.   HPV immunization. / 3 doses over 6 months, if 26 and younger.   Measles, mumps, rubella (MMR) immunization. / You need at least 1 dose of MMR if you were born in 1957 or later. You may also need a 2nd dose.   Meningococcal immunization. / 1 dose if you are age 19 to 21 years and a first-year college student living in a residence hall, or have one of several medical conditions, you need to get vaccinated against meningococcal disease. You may also need additional booster doses.   Varicella immunization.** / Consult your caregiver.   Hepatitis A immunization.** / Consult your caregiver. 2 doses, 6 to 18 months apart.   Hepatitis B  immunization.** / Consult your caregiver. 3 doses usually over 6 months.  Ages 40 to 64  Blood pressure check.** / Every 1 to 2 years.   Lipid and cholesterol check.** / Every 5 years beginning at age 20.   Fecal occult blood test (FOBT) of stool. / Every year beginning at age 50 and continuing until age 75. You may not have to do this test if   you get colonoscopy every 10 years.   Flexible sigmoidoscopy** or colonoscopy.** / Every 5 years for a flexible sigmoidoscopy or every 10 years for a colonoscopy beginning at age 50 and continuing until age 75.   Hepatitis C blood test.** / For all people born from 1945 through 1965 and any individual with known risks for hepatitis C.   Skin self-exam. / Monthly.   Influenza immunization.** / Every year.   Pneumococcal polysaccharide immunization.** / 1 to 2 doses if you smoke cigarettes or if you have certain chronic medical conditions.   Tetanus, diphtheria, pertussis (Tdap/Td) immunization.** / A one-time dose of Tdap vaccine. After that, you need a Td booster dose every 10 years.   Measles, mumps, rubella (MMR) immunization. / You need at least 1 dose of MMR if you were born in 1957 or later. You may also need a 2nd dose.   Varicella immunization.**/ Consult your caregiver.   Meningococcal immunization.** / Consult your caregiver.   Hepatitis A immunization.** / Consult your caregiver. 2 doses, 6 to 18 months apart.   Hepatitis B immunization.** / Consult your caregiver. 3 doses, usually over 6 months.  Ages 65 and over  Blood pressure check.** / Every 1 to 2 years.   Lipid and cholesterol check.**/ Every 5 years beginning at age 20.   Fecal occult blood test (FOBT) of stool. / Every year beginning at age 50 and continuing until age 75. You may not have to do this test if you get colonoscopy every 10 years.   Flexible sigmoidoscopy** or colonoscopy.** / Every 5 years for a flexible sigmoidoscopy or every 10 years for a colonoscopy  beginning at age 50 and continuing until age 75.   Hepatitis C blood test.** / For all people born from 1945 through 1965 and any individual with known risks for hepatitis C.   Abdominal aortic aneurysm (AAA) screening.** / A one-time screening for ages 65 to 75 years who are current or former smokers.   Skin self-exam. / Monthly.   Influenza immunization.** / Every year.   Pneumococcal polysaccharide immunization.** / 1 dose at age 65 (or older) if you have never been vaccinated.   Tetanus, diphtheria, pertussis (Tdap, Td) immunization. / A one-time dose of Tdap vaccine if you are over 65 and have contact with an infant, are a healthcare worker, or simply want to be protected from whooping cough. After that, you need a Td booster dose every 10 years.   Varicella immunization. ** / Consult your caregiver.   Meningococcal immunization.** / Consult your caregiver.   Hepatitis A immunization. ** / Consult your caregiver. 2 doses, 6 to 18 months apart.   Hepatitis B immunization.** / Check with your caregiver. 3 doses, usually over 6 months.  **Family history and personal history of risk and conditions may change your caregiver's recommendations. Document Released: 10/10/2001 Document Revised: 08/03/2011 Document Reviewed: 01/09/2011 [employer] Patient Information 2012 [employer]. 

## 2012-01-19 NOTE — Progress Notes (Signed)
Subjective:    Patient ID: Thomas Levy, male    DOB: May 30, 1956, 56 y.o.   MRN: [id number]  HPI Pt here for cpe and labs.  No complaints.    Review of Systems Review of Systems  Constitutional: Negative for activity change, appetite change and fatigue.  HENT: Negative for hearing loss, congestion, tinnitus and ear discharge.  dentist q83m Eyes: Negative for visual disturbance (see optho q1y -- vision corrected to 20/20 with glasses).  Respiratory: Negative for cough, chest tightness and shortness of breath.   Cardiovascular: Negative for chest pain, palpitations and leg swelling.  Gastrointestinal: Negative for abdominal pain, diarrhea, constipation and abdominal distention.  Genitourinary: Negative for urgency, frequency, decreased urine volume and difficulty urinating.  Musculoskeletal: Negative for back pain, arthralgias and gait problem.  Skin: Negative for color change, pallor and rash.  Neurological: Negative for dizziness, light-headedness, numbness and headaches.  Hematological: Negative for adenopathy. Does not bruise/bleed easily.  Psychiatric/Behavioral: Negative for suicidal ideas, confusion, sleep disturbance, self-injury, dysphoric mood, decreased concentration and agitation.   Past Medical History  Diagnosis Date  . Hypertension   . Hyperlipidemia      History   Social History  . Marital Status: Married    Spouse Name: N/A    Number of Children: N/A  . Years of Education: N/A   Occupational History  . Not on file.   Social History Main Topics  . Smoking status: Never Smoker   . Smokeless tobacco: Not on file  . Alcohol Use: 4.2 oz/week    7 Glasses of wine per week  . Drug Use: No  . Sexually Active: Yes -- Male partner(s)   Other Topics Concern  . Not on file   Social History Narrative   Gym-- 4 days a week for 1 hour   Family History  Problem Relation Age of Onset  . Hypertension Mother   . Kidney disease Father     kidney disease-- on  dialysis  . Hypertension Brother   . Hyperlipidemia Brother       Objective:   Physical Exam BP 98/72  Pulse 59  Temp(Src) 97.8 F (36.6 C) (Oral)  Ht 5\' 7"  (1.702 m)  Wt 145 lb (65.772 kg)  BMI 22.71 kg/m2  SpO2 95%  General Appearance:    Alert, cooperative, no distress, appears stated age  Head:    Normocephalic, without obvious abnormality, atraumatic  Eyes:    PERRL, conjunctiva/corneas clear, EOM's intact, fundi    benign, both eyes       Ears:    Normal TM's and external ear canals, both ears  Nose:   Nares normal, septum midline, mucosa normal, no drainage   or sinus tenderness  Throat:   Lips, mucosa, and tongue normal; teeth and gums normal  Neck:   Supple, symmetrical, trachea midline, no adenopathy;       thyroid:  No enlargement/tenderness/nodules; no carotid   bruit or JVD  Back:     Symmetric, no curvature, ROM normal, no CVA tenderness  Lungs:     Clear to auscultation bilaterally, respirations unlabored  Chest wall:    No tenderness or deformity  Heart:    Regular rate and rhythm, S1 and S2 normal, no murmur, rub   or gallop  Abdomen:     Soft, non-tender, bowel sounds active all four quadrants,    no masses, no organomegaly  Genitalia:    Normal male without lesion, discharge or tenderness  Rectal:    Normal tone,  normal prostate, no masses or tenderness;   guaiac negative stool  Extremities:   Extremities normal, atraumatic, no cyanosis or edema  Pulses:   2+ and symmetric all extremities  Skin:   Skin color, texture, turgor normal, no rashes or lesions  Lymph nodes:   Cervical, supraclavicular, and axillary nodes normal  Neurologic:   CNII-XII intact. Normal strength, sensation and reflexes      throughout         Assessment & Plan:  cpe-- check labs,  ghm uds htn--stable , con't meds Hyperlipidemia--con't meds, check labs

## 2012-01-19 NOTE — Progress Notes (Signed)
  Subjective:    Patient ID: Thomas Levy, male    DOB: 1956/06/09, 56 y.o.   MRN: [id number]  HPI    Review of Systems Referral done    Objective:   Physical Exam        Assessment & Plan:

## 2012-01-20 ENCOUNTER — Other Ambulatory Visit: Payer: Self-pay | Admitting: Family Medicine

## 2012-01-28 ENCOUNTER — Other Ambulatory Visit: Payer: Self-pay | Admitting: Family Medicine

## 2012-01-31 ENCOUNTER — Telehealth: Payer: Self-pay | Admitting: Family Medicine

## 2012-01-31 ENCOUNTER — Other Ambulatory Visit: Payer: Self-pay | Admitting: Family Medicine

## 2012-01-31 DIAGNOSIS — Z0000 Encounter for general adult medical examination without abnormal findings: Secondary | ICD-10-CM

## 2012-01-31 MED ORDER — ALLOPURINOL 300 MG PO TABS: 300.0000 mg | ORAL_TABLET | Freq: Every day | ORAL | Status: DC

## 2012-01-31 MED ORDER — LISINOPRIL-HYDROCHLOROTHIAZIDE 20-12.5 MG PO TABS: 2.0000 | ORAL_TABLET | Freq: Every day | ORAL | Status: DC

## 2012-01-31 MED ORDER — ATORVASTATIN CALCIUM 20 MG PO TABS: ORAL_TABLET | ORAL | Status: DC

## 2012-01-31 NOTE — Telephone Encounter (Signed)
Ok to refill all x6 months

## 2012-01-31 NOTE — Telephone Encounter (Signed)
Dr.Lowne this patient has not had labs done since Oct 2012. I have mailed him a letter to schedule labs, but no pending apt, would you still like for him to have a 6 mo supply of medication?  Please advise    KP

## 2012-01-31 NOTE — Telephone Encounter (Signed)
Pt would like refills on all his medications.

## 2012-01-31 NOTE — Telephone Encounter (Signed)
He just had a physical---ok ---just do 2 months

## 2012-02-12 ENCOUNTER — Other Ambulatory Visit: Payer: Self-pay | Admitting: Family Medicine

## 2012-02-12 MED ORDER — LISINOPRIL-HYDROCHLOROTHIAZIDE 20-12.5 MG PO TABS: 2.0000 | ORAL_TABLET | Freq: Every day | ORAL | Status: DC

## 2012-02-12 NOTE — Telephone Encounter (Signed)
refill lisinopril - hctz 20-12.5mg  tab # 60, take 2-tablets by mouth daily, last fill per pharmacy 5.20.13  Last wrt. 6.5.13, qty 60 qt/1-refill Last ov 5.24.13, labs ordered but not completed

## 2012-03-28 ENCOUNTER — Telehealth: Payer: Self-pay | Admitting: Family Medicine

## 2012-03-28 NOTE — Telephone Encounter (Signed)
Thomas Levy- this pt came in to talk with you but it was during lunch I told him I would have you call him back when you were free, amym

## 2012-03-28 NOTE — Telephone Encounter (Signed)
Discussed with patient and he wanted refills on meds but I made him aware he needs to have labs, he is scheduled for tomorrow morning.     KP

## 2012-03-29 ENCOUNTER — Other Ambulatory Visit (INDEPENDENT_AMBULATORY_CARE_PROVIDER_SITE_OTHER): Payer: BC Managed Care – PPO

## 2012-03-29 DIAGNOSIS — Z0000 Encounter for general adult medical examination without abnormal findings: Secondary | ICD-10-CM

## 2012-03-29 LAB — CBC WITH DIFFERENTIAL/PLATELET
Basophils Absolute: 0 10*3/uL (ref 0.0–0.1)
Basophils Relative: 0.9 % (ref 0.0–3.0)
Eosinophils Absolute: 0.3 10*3/uL (ref 0.0–0.7)
Eosinophils Relative: 5 % (ref 0.0–5.0)
HCT: 44.3 % (ref 39.0–52.0)
Hemoglobin: 15.1 g/dL (ref 13.0–17.0)
Lymphocytes Relative: 44.3 % (ref 12.0–46.0)
Monocytes Absolute: 0.3 10*3/uL (ref 0.1–1.0)
Monocytes Relative: 6.4 % (ref 3.0–12.0)
RBC: 4.57 Mil/uL (ref 4.22–5.81)
RDW: 13.5 % (ref 11.5–14.6)
WBC: 5.1 10*3/uL (ref 4.5–10.5)

## 2012-03-29 LAB — HEPATIC FUNCTION PANEL
Bilirubin, Direct: 0.1 mg/dL (ref 0.0–0.3)
Total Bilirubin: 0.9 mg/dL (ref 0.3–1.2)

## 2012-03-29 LAB — BASIC METABOLIC PANEL
GFR: 105.89 mL/min (ref >60.00)
Glucose, Bld: 86 mg/dL (ref 70–99)
Potassium: 4.1 mEq/L (ref 3.5–5.1)
Sodium: 139 mEq/L (ref 135–145)

## 2012-03-29 LAB — LIPID PANEL
HDL: 71.4 mg/dL (ref >39.00)
LDL Cholesterol: 99 mg/dL (ref 0–99)
Total CHOL/HDL Ratio: 3
Triglycerides: 61 mg/dL (ref 0.0–149.0)
VLDL: 12.2 mg/dL (ref 0.0–40.0)

## 2012-03-29 NOTE — Progress Notes (Signed)
Labs only

## 2012-05-03 ENCOUNTER — Other Ambulatory Visit: Payer: Self-pay | Admitting: Family Medicine

## 2012-05-19 ENCOUNTER — Other Ambulatory Visit: Payer: Self-pay | Admitting: Family Medicine

## 2012-08-02 ENCOUNTER — Other Ambulatory Visit: Payer: Self-pay | Admitting: Family Medicine

## 2012-10-04 ENCOUNTER — Other Ambulatory Visit: Payer: Self-pay | Admitting: Family Medicine

## 2012-11-06 ENCOUNTER — Telehealth: Payer: Self-pay | Admitting: Family Medicine

## 2012-11-06 DIAGNOSIS — I10 Essential (primary) hypertension: Secondary | ICD-10-CM

## 2012-11-06 NOTE — Telephone Encounter (Signed)
Also add Allopurinol 300 MG Tablet requesting 90-day supply  No instructions or last fill listed  Last wrt as 12.6.13 #30 x 30 TAKE 1 TABLET (300 MG TOTAL) BY MOUTH DAILY

## 2012-11-06 NOTE — Telephone Encounter (Signed)
refill  Lisinopril-HCTZ (Tab)  20-12.5 MG no instuctions or last fill date date - requesting 90-day supply last wrt 2.7.14 #60

## 2012-11-07 MED ORDER — ALLOPURINOL 300 MG PO TABS: ORAL_TABLET | ORAL | Status: DC

## 2012-11-07 MED ORDER — LISINOPRIL-HYDROCHLOROTHIAZIDE 20-12.5 MG PO TABS: ORAL_TABLET | ORAL | Status: DC

## 2013-01-31 ENCOUNTER — Ambulatory Visit (INDEPENDENT_AMBULATORY_CARE_PROVIDER_SITE_OTHER): Payer: BC Managed Care – PPO | Admitting: Family Medicine

## 2013-01-31 ENCOUNTER — Encounter: Payer: Self-pay | Admitting: Family Medicine

## 2013-01-31 VITALS — BP 116/78 | HR 58 | Temp 98.4°F | Resp 12 | Ht 67.0 in | Wt 146.0 lb

## 2013-01-31 DIAGNOSIS — E785 Hyperlipidemia, unspecified: Secondary | ICD-10-CM | POA: Insufficient documentation

## 2013-01-31 DIAGNOSIS — Z0000 Encounter for general adult medical examination without abnormal findings: Secondary | ICD-10-CM

## 2013-01-31 DIAGNOSIS — N529 Male erectile dysfunction, unspecified: Secondary | ICD-10-CM | POA: Insufficient documentation

## 2013-01-31 DIAGNOSIS — I10 Essential (primary) hypertension: Secondary | ICD-10-CM | POA: Insufficient documentation

## 2013-01-31 DIAGNOSIS — M109 Gout, unspecified: Secondary | ICD-10-CM | POA: Insufficient documentation

## 2013-01-31 LAB — CBC WITH DIFFERENTIAL/PLATELET
Basophils Relative: 0.7 % (ref 0.0–3.0)
Eosinophils Absolute: 0.2 10*3/uL (ref 0.0–0.7)
Eosinophils Relative: 2.9 % (ref 0.0–5.0)
HCT: 43 % (ref 39.0–52.0)
Lymphs Abs: 2 10*3/uL (ref 0.7–4.0)
MCHC: 34.2 g/dL (ref 30.0–36.0)
MCV: 97.3 fl (ref 78.0–100.0)
Monocytes Absolute: 0.4 10*3/uL (ref 0.1–1.0)
Neutrophils Relative %: 50.7 % (ref 43.0–77.0)
RBC: 4.42 Mil/uL (ref 4.22–5.81)
WBC: 5.1 10*3/uL (ref 4.5–10.5)

## 2013-01-31 LAB — HEPATIC FUNCTION PANEL
ALT: 19 U/L (ref 0–53)
Albumin: 4.5 g/dL (ref 3.5–5.2)
Bilirubin, Direct: 0.2 mg/dL (ref 0.0–0.3)
Total Protein: 7 g/dL (ref 6.0–8.3)

## 2013-01-31 LAB — POCT URINALYSIS DIPSTICK
Bilirubin, UA: NEGATIVE
Blood, UA: NEGATIVE
Glucose, UA: NEGATIVE
Ketones, UA: NEGATIVE
Nitrite, UA: NEGATIVE
Spec Grav, UA: <=1.005
pH, UA: 6.5

## 2013-01-31 LAB — BASIC METABOLIC PANEL
BUN: 16 mg/dL (ref 6–23)
Calcium: 9.7 mg/dL (ref 8.4–10.5)
Creatinine, Ser: 1 mg/dL (ref 0.4–1.5)

## 2013-01-31 LAB — LIPID PANEL
Cholesterol: 175 mg/dL (ref 0–200)
HDL: 75.1 mg/dL (ref >39.00)
Triglycerides: 48 mg/dL (ref 0.0–149.0)

## 2013-01-31 LAB — TSH: TSH: 1.29 u[IU]/mL (ref 0.35–5.50)

## 2013-01-31 MED ORDER — SILDENAFIL CITRATE 100 MG PO TABS: 50.0000 mg | ORAL_TABLET | Freq: Every day | ORAL | Status: DC | PRN

## 2013-01-31 MED ORDER — ALLOPURINOL 300 MG PO TABS: ORAL_TABLET | ORAL | Status: DC

## 2013-01-31 MED ORDER — LISINOPRIL-HYDROCHLOROTHIAZIDE 20-12.5 MG PO TABS: 2.0000 | ORAL_TABLET | Freq: Every day | ORAL | Status: DC

## 2013-01-31 NOTE — Patient Instructions (Addendum)
Preventive Care for Adults, Male  A healthy lifestyle and preventive care can promote health and wellness. Preventive health guidelines for men include the following key practices:  · A routine yearly physical is a good way to check with your caregiver about your health and preventative screening. It is a chance to share any concerns and updates on your health, and to receive a thorough exam.  · Visit your dentist for a routine exam and preventative care every 6 months. Brush your teeth twice a day and floss once a day. Good oral hygiene prevents tooth decay and gum disease.  · The frequency of eye exams is based on your age, health, family medical history, use of contact lenses, and other factors. Follow your caregiver's recommendations for frequency of eye exams.  · Eat a healthy diet. Foods like vegetables, fruits, whole grains, low-fat dairy products, and lean protein foods contain the nutrients you need without too many calories. Decrease your intake of foods high in solid fats, added sugars, and salt. Eat the right amount of calories for you. Get information about a proper diet from your caregiver, if necessary.  · Regular physical exercise is one of the most important things you can do for your health. Most adults should get at least 150 minutes of moderate-intensity exercise (any activity that increases your heart rate and causes you to sweat) each week. In addition, most adults need muscle-strengthening exercises on 2 or more days a week.  · Maintain a healthy weight. The body mass index (BMI) is a screening tool to identify possible weight problems. It provides an estimate of body fat based on height and weight. Your caregiver can help determine your BMI, and can help you achieve or maintain a healthy weight. For adults 20 years and older:  · A BMI below 18.5 is considered underweight.  · A BMI of 18.5 to 24.9 is normal.  · A BMI of 25 to 29.9 is considered overweight.  · A BMI of 30 and above is  considered obese.  · Maintain normal blood lipids and cholesterol levels by exercising and minimizing your intake of saturated fat. Eat a balanced diet with plenty of fruit and vegetables. Blood tests for lipids and cholesterol should begin at age 20 and be repeated every 5 years. If your lipid or cholesterol levels are high, you are over 50, or you are a high risk for heart disease, you may need your cholesterol levels checked more frequently. Ongoing high lipid and cholesterol levels should be treated with medicines if diet and exercise are not effective.  · If you smoke, find out from your caregiver how to quit. If you do not use tobacco, do not start.  · If you choose to drink alcohol, do not exceed 2 drinks per day. One drink is considered to be 12 ounces (355 mL) of beer, 5 ounces (148 mL) of wine, or 1.5 ounces (44 mL) of liquor.  · Avoid use of street drugs. Do not share needles with anyone. Ask for help if you need support or instructions about stopping the use of drugs.  · High blood pressure causes heart disease and increases the risk of stroke. Your blood pressure should be checked at least every 1 to 2 years. Ongoing high blood pressure should be treated with medicines, if weight loss and exercise are not effective.  · If you are 45 to 58 years old, ask your caregiver if you should take aspirin to prevent heart disease.  · Diabetes screening involves taking   a blood sample to check your fasting blood sugar level. This should be done once every 3 years, after age 45, if you are within normal weight and without risk factors for diabetes. Testing should be considered at a younger age or be carried out more frequently if you are overweight and have at least 1 risk factor for diabetes.  · Colorectal cancer can be detected and often prevented. Most routine colorectal cancer screening begins at the age of 50 and continues through age 75. However, your caregiver may recommend screening at an earlier age if you  have risk factors for colon cancer. On a yearly basis, your caregiver may provide home test kits to check for hidden blood in the stool. Use of a small camera at the end of a tube, to directly examine the colon (sigmoidoscopy or colonoscopy), can detect the earliest forms of colorectal cancer. Talk to your caregiver about this at age 50, when routine screening begins.  Direct examination of the colon should be repeated every 5 to 10 years through age 75, unless early forms of pre-cancerous polyps or small growths are found.  · Hepatitis C blood testing is recommended for all people born from 1945 through 1965 and any individual with known risks for hepatitis C.  · Practice safe sex. Use condoms and avoid high-risk sexual practices to reduce the spread of sexually transmitted infections (STIs). STIs include gonorrhea, chlamydia, syphilis, trichomonas, herpes, HPV, and human immunodeficiency virus (HIV). Herpes, HIV, and HPV are viral illnesses that have no cure. They can result in disability, cancer, and death.  · A one-time screening for abdominal aortic aneurysm (AAA) and surgical repair of large AAAs by sound wave imaging (ultrasonography) is recommended for ages 65 to 75 years who are current or former smokers.  · Healthy men should no longer receive prostate-specific antigen (PSA) blood tests as part of routine cancer screening. Consult with your caregiver about prostate cancer screening.  · Testicular cancer screening is not recommended for adult males who have no symptoms. Screening includes self-exam, caregiver exam, and other screening tests. Consult with your caregiver about any symptoms you have or any concerns you have about testicular cancer.  · Use sunscreen with skin protection factor (SPF) of 30 or more. Apply sunscreen liberally and repeatedly throughout the day. You should seek shade when your shadow is shorter than you. Protect yourself by wearing long sleeves, pants, a wide-brimmed hat, and  sunglasses year round, whenever you are outdoors.  · Once a month, do a whole body skin exam, using a mirror to look at the skin on your back. Notify your caregiver of new moles, moles that have irregular borders, moles that are larger than a pencil eraser, or moles that have changed in shape or color.  · Stay current with required immunizations.  · Influenza. You need a dose every fall (or winter). The composition of the flu vaccine changes each year, so being vaccinated once is not enough.  · Pneumococcal polysaccharide. You need 1 to 2 doses if you smoke cigarettes or if you have certain chronic medical conditions. You need 1 dose at age 65 (or older) if you have never been vaccinated.  · Tetanus, diphtheria, pertussis (Tdap, Td). Get 1 dose of Tdap vaccine if you are younger than age 65 years, are over 65 and have contact with an infant, are a healthcare worker, or simply want to be protected from whooping cough. After that, you need a Td booster dose every 10 years. Consult your   caregiver if you have not had at least 3 tetanus and diphtheria-containing shots sometime in your life or have a deep or dirty wound.  · HPV. This vaccine is recommended for males 13 through 58 years of age. This vaccine may be given to men 22 through 58 years of age who have not completed the 3 dose series. It is recommended for men through age 26 who have sex with men or whose immune system is weakened because of HIV infection, other illness, or medications. The vaccine is given in 3 doses over 6 months.  · Measles, mumps, rubella (MMR). You need at least 1 dose of MMR if you were born in 1957 or later. You may also need a 2nd dose.  · Meningococcal. If you are age 19 to 21 years and a first-year college student living in a residence hall, or have one of several medical conditions, you need to get vaccinated against meningococcal disease. You may also need additional booster doses.  · Zoster (shingles). If you are age 60 years or  older, you should get this vaccine.  · Varicella (chickenpox). If you have never had chickenpox or you were vaccinated but received only 1 dose, talk to your caregiver to find out if you need this vaccine.  · Hepatitis A. You need this vaccine if you have a specific risk factor for hepatitis A virus infection, or you simply wish to be protected from this disease. The vaccine is usually given as 2 doses, 6 to 18 months apart.  · Hepatitis B. You need this vaccine if you have a specific risk factor for hepatitis B virus infection or you simply wish to be protected from this disease. The vaccine is given in 3 doses, usually over 6 months.  Preventative Service / Frequency  Ages 19 to 39  · Blood pressure check.** / Every 1 to 2 years.  · Lipid and cholesterol check.** / Every 5 years beginning at age 20.  · Hepatitis C blood test.** / For any individual with known risks for hepatitis C.  · Skin self-exam. / Monthly.  · Influenza immunization.** / Every year.  · Pneumococcal polysaccharide immunization.** / 1 to 2 doses if you smoke cigarettes or if you have certain chronic medical conditions.  · Tetanus, diphtheria, pertussis (Tdap,Td) immunization. / A one-time dose of Tdap vaccine. After that, you need a Td booster dose every 10 years.  · HPV immunization. / 3 doses over 6 months, if 26 and younger.  · Measles, mumps, rubella (MMR) immunization. / You need at least 1 dose of MMR if you were born in 1957 or later. You may also need a 2nd dose.  · Meningococcal immunization. / 1 dose if you are age 19 to 21 years and a first-year college student living in a residence hall, or have one of several medical conditions, you need to get vaccinated against meningococcal disease. You may also need additional booster doses.  · Varicella immunization.** / Consult your caregiver.  · Hepatitis A immunization.** / Consult your caregiver. 2 doses, 6 to 18 months apart.  · Hepatitis B immunization.** / Consult your caregiver. 3 doses  usually over 6 months.  Ages 40 to 64  · Blood pressure check.** / Every 1 to 2 years.  · Lipid and cholesterol check.** / Every 5 years beginning at age 20.  · Fecal occult blood test (FOBT) of stool. / Every year beginning at age 50 and continuing until age 75. You may not have   to do this test if you get colonoscopy every 10 years.  · Flexible sigmoidoscopy** or colonoscopy.** / Every 5 years for a flexible sigmoidoscopy or every 10 years for a colonoscopy beginning at age 50 and continuing until age 75.  · Hepatitis C blood test.** / For all people born from 1945 through 1965 and any individual with known risks for hepatitis C.  · Skin self-exam. / Monthly.  · Influenza immunization.** / Every year.  · Pneumococcal polysaccharide immunization.** / 1 to 2 doses if you smoke cigarettes or if you have certain chronic medical conditions.  · Tetanus, diphtheria, pertussis (Tdap/Td) immunization.** / A one-time dose of Tdap vaccine. After that, you need a Td booster dose every 10 years.  · Measles, mumps, rubella (MMR) immunization.  / You need at least 1 dose of MMR if you were born in 1957 or later. You may also need a 2nd dose.  · Varicella immunization.**/ Consult your caregiver.  · Meningococcal immunization.** / Consult your caregiver.  · Hepatitis A immunization.** / Consult your caregiver. 2 doses, 6 to 18 months apart.  · Hepatitis B immunization.** / Consult your caregiver. 3 doses, usually over 6 months.  Ages 65 and over  · Blood pressure check.** / Every 1 to 2 years.  · Lipid and cholesterol check.**/ Every 5 years beginning at age 20.  · Fecal occult blood test (FOBT) of stool. / Every year beginning at age 50 and continuing until age 75. You may not have to do this test if you get colonoscopy every 10 years.  · Flexible sigmoidoscopy** or colonoscopy.** / Every 5 years for a flexible sigmoidoscopy or every 10 years for a colonoscopy beginning at age 50 and continuing until age 75.  · Hepatitis C blood  test.** / For all people born from 1945 through 1965 and any individual with known risks for hepatitis C.  · Abdominal aortic aneurysm (AAA) screening.** / A one-time screening for ages 65 to 75 years who are current or former smokers.  · Skin self-exam. / Monthly.  · Influenza immunization.** / Every year.  · Pneumococcal polysaccharide immunization.** / 1 dose at age 65 (or older) if you have never been vaccinated.  · Tetanus, diphtheria, pertussis (Tdap, Td) immunization. / A one-time dose of Tdap vaccine if you are over 65 and have contact with an infant, are a healthcare worker, or simply want to be protected from whooping cough. After that, you need a Td booster dose every 10 years.  · Varicella immunization. ** / Consult your caregiver.  · Meningococcal immunization.** / Consult your caregiver.  · Hepatitis A immunization. ** / Consult your caregiver. 2 doses, 6 to 18 months apart.  · Hepatitis B immunization.** / Check with your caregiver. 3 doses, usually over 6 months.  **Family history and personal history of risk and conditions may change your caregiver's recommendations.  Document Released: 10/10/2001 Document Revised: 11/06/2011 Document Reviewed: 01/09/2011  [employer]® Patient Information ©2014 [employer].

## 2013-01-31 NOTE — Assessment & Plan Note (Signed)
Refill meds stable 

## 2013-01-31 NOTE — Assessment & Plan Note (Signed)
Refill Viagra.

## 2013-01-31 NOTE — Progress Notes (Signed)
Subjective:    Patient ID: Thomas Levy, male    DOB: 1955-09-22, 57 y.o.   MRN: [id number]  HPI Pt here for cpe and labs.  No complaints.  He also needs drug refills.    Review of Systems Review of Systems  Constitutional: Negative for activity change, appetite change and fatigue.  HENT: Negative for hearing loss, congestion, tinnitus and ear discharge.  dentist q17m Eyes: Negative for visual disturbance (see optho q1y -- vision corrected to 20/20 with glasses).  Respiratory: Negative for cough, chest tightness and shortness of breath.   Cardiovascular: Negative for chest pain, palpitations and leg swelling.  Gastrointestinal: Negative for abdominal pain, diarrhea, constipation and abdominal distention.  Genitourinary: Negative for urgency, frequency, decreased urine volume and difficulty urinating.  Musculoskeletal: Negative for back pain, arthralgias and gait problem.  Skin: Negative for color change, pallor and rash.  Neurological: Negative for dizziness, light-headedness, numbness and headaches.  Hematological: Negative for adenopathy. Does not bruise/bleed easily.  Psychiatric/Behavioral: Negative for suicidal ideas, confusion, sleep disturbance, self-injury, dysphoric mood, decreased concentration and agitation.   Past Medical History  Diagnosis Date  . Hypertension   . Hyperlipidemia    History  Substance Use Topics  . Smoking status: Never Smoker   . Smokeless tobacco: Not on file  . Alcohol Use: 4.2 oz/week    7 Glasses of wine per week   Family History  Problem Relation Age of Onset  . Hypertension Mother   . Cancer Mother     breast  . Kidney disease Father     kidney disease-- on dialysis  . Hypertension Brother   . Hyperlipidemia Brother    Current Outpatient Prescriptions on File Prior to Visit  Medication Sig Dispense Refill  . atorvastatin (LIPITOR) 20 MG tablet TAKE 1 TABLET BY MOUTH DAILY  30 tablet  0  . atorvastatin (LIPITOR) 20 MG tablet TAKE 1  TABLET BY MOUTH DAILY  30 tablet  0  . atorvastatin (LIPITOR) 20 MG tablet 1 po qhs  30 tablet  1  . lisinopril-hydrochlorothiazide (PRINZIDE,ZESTORETIC) 20-12.5 MG per tablet TAKE 2 TABLETS BY MOUTH DAILY.  60 tablet  1  . lisinopril-hydrochlorothiazide (PRINZIDE,ZESTORETIC) 20-12.5 MG per tablet 2 tabs by mouth daily---office visit due now  60 tablet  0  . lisinopril-hydrochlorothiazide (PRINZIDE,ZESTORETIC) 20-12.5 MG per tablet 2 po qd  180 tablet  0   No current facility-administered medications on file prior to visit.   .      Objective:   Physical Exam BP 116/78  Pulse 58  Temp(Src) 98.4 F (36.9 C) (Oral)  Resp 12  Ht 5\' 7"  (1.702 m)  Wt 146 lb (66.225 kg)  BMI 22.86 kg/m2  SpO2 95%  General Appearance:    Alert, cooperative, no distress, appears stated age  Head:    Normocephalic, without obvious abnormality, atraumatic  Eyes:    PERRL, conjunctiva/corneas clear, EOM's intact, fundi    benign, both eyes       Ears:    Normal TM's and external ear canals, both ears  Nose:   Nares normal, septum midline, mucosa normal, no drainage   or sinus tenderness  Throat:   Lips, mucosa, and tongue normal; teeth and gums normal  Neck:   Supple, symmetrical, trachea midline, no adenopathy;       thyroid:  No enlargement/tenderness/nodules; no carotid   bruit or JVD  Back:     Symmetric, no curvature, ROM normal, no CVA tenderness  Lungs:     Clear  to auscultation bilaterally, respirations unlabored  Chest wall:    No tenderness or deformity  Heart:    Regular rate and rhythm, S1 and S2 normal, no murmur, rub   or gallop  Abdomen:     Soft, non-tender, bowel sounds active all four quadrants,    no masses, no organomegaly  Genitalia:    Normal male without lesion, discharge or tenderness  Rectal:    Normal tone, normal prostate, no masses or tenderness;   guaiac negative stool  Extremities:   Extremities normal, atraumatic, no cyanosis or edema  Pulses:   2+ and symmetric all  extremities  Skin:   Skin color, texture, turgor normal, no rashes or lesions  Lymph nodes:   Cervical, supraclavicular, and axillary nodes normal  Neurologic:   CNII-XII intact. Normal strength, sensation and reflexes      throughout        Assessment & Plan:  cpe-- check labs          See AVS          ghm utd

## 2013-01-31 NOTE — Assessment & Plan Note (Signed)
Check labs  con't allopurinol 

## 2013-01-31 NOTE — Assessment & Plan Note (Signed)
Check labs 

## 2013-02-01 ENCOUNTER — Other Ambulatory Visit: Payer: Self-pay | Admitting: Family Medicine

## 2013-08-30 ENCOUNTER — Other Ambulatory Visit: Payer: Self-pay | Admitting: Family Medicine

## 2013-09-01 ENCOUNTER — Other Ambulatory Visit: Payer: Self-pay | Admitting: Family Medicine

## 2013-09-02 ENCOUNTER — Other Ambulatory Visit: Payer: Self-pay | Admitting: Family Medicine

## 2013-12-07 ENCOUNTER — Other Ambulatory Visit: Payer: Self-pay | Admitting: Family Medicine

## 2014-01-20 ENCOUNTER — Other Ambulatory Visit: Payer: Self-pay | Admitting: Family Medicine

## 2014-02-05 ENCOUNTER — Telehealth: Payer: Self-pay

## 2014-02-05 NOTE — Telephone Encounter (Signed)
Medication List and allergies:  Updated and Reviewed  90 day supply/mail order: n/a Local prescriptions:  [id number] - [place], [place]  Immunization due: UTD  A/P: No changes to personal, family or [place] Flu- 04/2013 - per patient Tdap- 01/19/12 CCS- 12/20/12- internal hemorrhoids and diverticulosis; repeat in 10 years PSA- 01/31/13- 0.64    To discuss with provider: Nothing at this time.

## 2014-02-06 ENCOUNTER — Encounter: Payer: Self-pay | Admitting: Family Medicine

## 2014-02-06 ENCOUNTER — Ambulatory Visit (INDEPENDENT_AMBULATORY_CARE_PROVIDER_SITE_OTHER): Payer: BC Managed Care – PPO | Admitting: Family Medicine

## 2014-02-06 VITALS — BP 102/72 | HR 87 | Temp 98.5°F | Ht 67.0 in | Wt 144.8 lb

## 2014-02-06 DIAGNOSIS — E785 Hyperlipidemia, unspecified: Secondary | ICD-10-CM

## 2014-02-06 DIAGNOSIS — Z0000 Encounter for general adult medical examination without abnormal findings: Secondary | ICD-10-CM

## 2014-02-06 DIAGNOSIS — N529 Male erectile dysfunction, unspecified: Secondary | ICD-10-CM

## 2014-02-06 DIAGNOSIS — B356 Tinea cruris: Secondary | ICD-10-CM

## 2014-02-06 DIAGNOSIS — M109 Gout, unspecified: Secondary | ICD-10-CM

## 2014-02-06 DIAGNOSIS — I10 Essential (primary) hypertension: Secondary | ICD-10-CM

## 2014-02-06 LAB — CBC WITH DIFFERENTIAL/PLATELET
BASOS PCT: 0.6 % (ref 0.0–3.0)
Basophils Absolute: 0 10*3/uL (ref 0.0–0.1)
EOS ABS: 0.2 10*3/uL (ref 0.0–0.7)
Eosinophils Relative: 2.6 % (ref 0.0–5.0)
HEMATOCRIT: 46.6 % (ref 39.0–52.0)
Hemoglobin: 15.8 g/dL (ref 13.0–17.0)
LYMPHS ABS: 1.6 10*3/uL (ref 0.7–4.0)
Lymphocytes Relative: 25.6 % (ref 12.0–46.0)
MCHC: 33.9 g/dL (ref 30.0–36.0)
MCV: 96.9 fl (ref 78.0–100.0)
MONO ABS: 0.3 10*3/uL (ref 0.1–1.0)
Monocytes Relative: 5.2 % (ref 3.0–12.0)
NEUTROS PCT: 66 % (ref 43.0–77.0)
Neutro Abs: 4.2 10*3/uL (ref 1.4–7.7)
PLATELETS: 199 10*3/uL (ref 150.0–400.0)
RBC: 4.8 Mil/uL (ref 4.22–5.81)
RDW: 13.3 % (ref 11.5–15.5)
WBC: 6.4 10*3/uL (ref 4.0–10.5)

## 2014-02-06 LAB — HEPATIC FUNCTION PANEL
ALT: 22 U/L (ref 0–53)
AST: 29 U/L (ref 0–37)
Albumin: 4.7 g/dL (ref 3.5–5.2)
Alkaline Phosphatase: 57 U/L (ref 39–117)
BILIRUBIN DIRECT: 0.2 mg/dL (ref 0.0–0.3)
Total Bilirubin: 0.9 mg/dL (ref 0.2–1.2)
Total Protein: 7.4 g/dL (ref 6.0–8.3)

## 2014-02-06 LAB — BASIC METABOLIC PANEL
BUN: 22 mg/dL (ref 6–23)
CHLORIDE: 98 meq/L (ref 96–112)
CO2: 32 mEq/L (ref 19–32)
CREATININE: 1 mg/dL (ref 0.4–1.5)
Calcium: 10 mg/dL (ref 8.4–10.5)
GFR: 84.23 mL/min (ref >60.00)
Glucose, Bld: 89 mg/dL (ref 70–99)
Potassium: 4.2 mEq/L (ref 3.5–5.1)
Sodium: 139 mEq/L (ref 135–145)

## 2014-02-06 LAB — LIPID PANEL
CHOL/HDL RATIO: 2
Cholesterol: 185 mg/dL (ref 0–200)
HDL: 81.7 mg/dL (ref >39.00)
LDL Cholesterol: 91 mg/dL (ref 0–99)
NonHDL: 103.3
Triglycerides: 64 mg/dL (ref 0.0–149.0)
VLDL: 12.8 mg/dL (ref 0.0–40.0)

## 2014-02-06 LAB — TSH: TSH: 1.16 u[IU]/mL (ref 0.35–4.50)

## 2014-02-06 LAB — URIC ACID: Uric Acid, Serum: 5.3 mg/dL (ref 4.0–7.8)

## 2014-02-06 LAB — PSA: PSA: 0.8 ng/mL (ref 0.10–4.00)

## 2014-02-06 MED ORDER — NYSTATIN 100000 UNIT/GM EX CREA: 1.0000 "application " | TOPICAL_CREAM | Freq: Two times a day (BID) | CUTANEOUS | Status: DC

## 2014-02-06 MED ORDER — LISINOPRIL-HYDROCHLOROTHIAZIDE 20-12.5 MG PO TABS: ORAL_TABLET | ORAL | Status: DC

## 2014-02-06 MED ORDER — ATORVASTATIN CALCIUM 20 MG PO TABS: ORAL_TABLET | ORAL | Status: DC

## 2014-02-06 MED ORDER — SILDENAFIL CITRATE 100 MG PO TABS: 50.0000 mg | ORAL_TABLET | Freq: Every day | ORAL | Status: DC | PRN

## 2014-02-06 MED ORDER — ALLOPURINOL 300 MG PO TABS: ORAL_TABLET | ORAL | Status: DC

## 2014-02-06 NOTE — Patient Instructions (Signed)
Preventive Care for Adults, Male A healthy lifestyle and preventive care can promote health and wellness. Preventive health guidelines for men include the following key practices:  A routine yearly physical is a good way to check with your health care provider about your health and preventative screening. It is a chance to share any concerns and updates on your health and to receive a thorough exam.  Visit your dentist for a routine exam and preventative care every 6 months. Brush your teeth twice a day and floss once a day. Good oral hygiene prevents tooth decay and gum disease.  The frequency of eye exams is based on your age, health, family medical history, use of contact lenses, and other factors. Follow your health care provider's recommendations for frequency of eye exams.  Eat a healthy diet. Foods such as vegetables, fruits, whole grains, low-fat dairy products, and lean protein foods contain the nutrients you need without too many calories. Decrease your intake of foods high in solid fats, added sugars, and salt. Eat the right amount of calories for you.Get information about a proper diet from your health care provider, if necessary.  Regular physical exercise is one of the most important things you can do for your health. Most adults should get at least 150 minutes of moderate-intensity exercise (any activity that increases your heart rate and causes you to sweat) each week. In addition, most adults need muscle-strengthening exercises on 2 or more days a week.  Maintain a healthy weight. The body mass index (BMI) is a screening tool to identify possible weight problems. It provides an estimate of body fat based on height and weight. Your health care provider can find your BMI and can help you achieve or maintain a healthy weight.For adults 20 years and older:  A BMI below 18.5 is considered underweight.  A BMI of 18.5 to 24.9 is normal.  A BMI of 25 to 29.9 is considered  overweight.  A BMI of 30 and above is considered obese.  Maintain normal blood lipids and cholesterol levels by exercising and minimizing your intake of saturated fat. Eat a balanced diet with plenty of fruit and vegetables. Blood tests for lipids and cholesterol should begin at age 42 and be repeated every 5 years. If your lipid or cholesterol levels are high, you are over 50, or you are at high risk for heart disease, you may need your cholesterol levels checked more frequently.Ongoing high lipid and cholesterol levels should be treated with medicines if diet and exercise are not working.  If you smoke, find out from your health care provider how to quit. If you do not use tobacco, do not start.  Lung cancer screening is recommended for adults aged 24 80 years who are at high risk for developing lung cancer because of a history of smoking. A yearly low-dose CT scan of the lungs is recommended for people who have at least a 30-pack-year history of smoking and are a current smoker or have quit within the past 15 years. A pack year of smoking is smoking an average of 1 pack of cigarettes a day for 1 year (for example: 1 pack a day for 30 years or 2 packs a day for 15 years). Yearly screening should continue until the smoker has stopped smoking for at least 15 years. Yearly screening should be stopped for people who develop a health problem that would prevent them from having lung cancer treatment.  If you choose to drink alcohol, do not have  more than 2 drinks per day. One drink is considered to be 12 ounces (355 mL) of beer, 5 ounces (148 mL) of wine, or 1.5 ounces (44 mL) of liquor.  Avoid use of street drugs. Do not share needles with anyone. Ask for help if you need support or instructions about stopping the use of drugs.  High blood pressure causes heart disease and increases the risk of stroke. Your blood pressure should be checked at least every 1 2 years. Ongoing high blood pressure should be  treated with medicines, if weight loss and exercise are not effective.  If you are 75 59 years old, ask your health care provider if you should take aspirin to prevent heart disease.  Diabetes screening involves taking a blood sample to check your fasting blood sugar level. This should be done once every 3 years, after age 19, if you are within normal weight and without risk factors for diabetes. Testing should be considered at a younger age or be carried out more frequently if you are overweight and have at least 1 risk factor for diabetes.  Colorectal cancer can be detected and often prevented. Most routine colorectal cancer screening begins at the age of 47 and continues through age 80. However, your health care provider may recommend screening at an earlier age if you have risk factors for colon cancer. On a yearly basis, your health care provider may provide home test kits to check for hidden blood in the stool. Use of a small camera at the end of a tube to directly examine the colon (sigmoidoscopy or colonoscopy) can detect the earliest forms of colorectal cancer. Talk to your health care provider about this at age 66, when routine screening begins. Direct exam of the colon should be repeated every 5 10 years through age 19, unless early forms of precancerous polyps or small growths are found.  People who are at an increased risk for hepatitis B should be screened for this virus. You are considered at high risk for hepatitis B if:  You were born in a country where hepatitis B occurs often. Talk with your health care provider about which countries are considered high-risk.  Your parents were born in a high-risk country and you have not received a shot to protect against hepatitis B (hepatitis B vaccine).  You have HIV or AIDS.  You use needles to inject street drugs.  You live with, or have sex with, someone who has hepatitis B.  You are a man who has sex with other men (MSM).  You get  hemodialysis treatment.  You take certain medicines for conditions such as cancer, organ transplantation, and autoimmune conditions.  Hepatitis C blood testing is recommended for all people born from [birth year] through 1965 and any individual with known risks for hepatitis C.  Practice safe sex. Use condoms and avoid high-risk sexual practices to reduce the spread of sexually transmitted infections (STIs). STIs include gonorrhea, chlamydia, syphilis, trichomonas, herpes, HPV, and human immunodeficiency virus (HIV). Herpes, HIV, and HPV are viral illnesses that have no cure. They can result in disability, cancer, and death.  A one-time screening for abdominal aortic aneurysm (AAA) and surgical repair of large AAAs by ultrasound are recommended for men ages 94 to 74 years who are current or former smokers.  Healthy men should no longer receive prostate-specific antigen (PSA) blood tests as part of routine cancer screening. Talk with your health care provider about prostate cancer screening.  Testicular cancer screening is not recommended  for adult males who have no symptoms. Screening includes self-exam, a health care provider exam, and other screening tests. Consult with your health care provider about any symptoms you have or any concerns you have about testicular cancer.  Use sunscreen. Apply sunscreen liberally and repeatedly throughout the day. You should seek shade when your shadow is shorter than you. Protect yourself by wearing long sleeves, pants, a wide-brimmed hat, and sunglasses year round, whenever you are outdoors.  Once a month, do a whole-body skin exam, using a mirror to look at the skin on your back. Tell your health care provider about new moles, moles that have irregular borders, moles that are larger than a pencil eraser, or moles that have changed in shape or color.  Stay current with required vaccines (immunizations).  Influenza vaccine. All adults should be immunized every  year.  Tetanus, diphtheria, and acellular pertussis (Td, Tdap) vaccine. An adult who has not previously received Tdap or who does not know his vaccine status should receive 1 dose of Tdap. This initial dose should be followed by tetanus and diphtheria toxoids (Td) booster doses every 10 years. Adults with an unknown or incomplete history of completing a 3-dose immunization series with Td-containing vaccines should begin or complete a primary immunization series including a Tdap dose. Adults should receive a Td booster every 10 years.  Varicella vaccine. An adult without evidence of immunity to varicella should receive 2 doses or a second dose if he has previously received 1 dose.  Human papillomavirus (HPV) vaccine. Males aged 44 21 years who have not received the vaccine previously should receive the 3-dose series. Males aged 43 26 years may be immunized. Immunization is recommended through the age of 50 years for any male who has sex with males and did not get any or all doses earlier. Immunization is recommended for any person with an immunocompromised condition through the age of 23 years if he did not get any or all doses earlier. During the 3-dose series, the second dose should be obtained 4 8 weeks after the first dose. The third dose should be obtained 24 weeks after the first dose and 16 weeks after the second dose.  Zoster vaccine. One dose is recommended for adults aged 96 years or older unless certain conditions are present.  Measles, mumps, and rubella (MMR) vaccine. Adults born before 55 generally are considered immune to measles and mumps. Adults born in [birth year] or later should have 1 or more doses of MMR vaccine unless there is a contraindication to the vaccine or there is laboratory evidence of immunity to each of the three diseases. A routine second dose of MMR vaccine should be obtained at least 28 days after the first dose for students attending postsecondary schools, health care  workers, or international travelers. People who received inactivated measles vaccine or an unknown type of measles vaccine during 1963 1967 should receive 2 doses of MMR vaccine. People who received inactivated mumps vaccine or an unknown type of mumps vaccine before 1979 and are at high risk for mumps infection should consider immunization with 2 doses of MMR vaccine. Unvaccinated health care workers born before [birth year] who lack laboratory evidence of measles, mumps, or rubella immunity or laboratory confirmation of disease should consider measles and mumps immunization with 2 doses of MMR vaccine or rubella immunization with 1 dose of MMR vaccine.  Pneumococcal 13-valent conjugate (PCV13) vaccine. When indicated, a person who is uncertain of his immunization history and has no record of immunization  should receive the PCV13 vaccine. An adult aged 67 years or older who has certain medical conditions and has not been previously immunized should receive 1 dose of PCV13 vaccine. This PCV13 should be followed with a dose of pneumococcal polysaccharide (PPSV23) vaccine. The PPSV23 vaccine dose should be obtained at least 8 weeks after the dose of PCV13 vaccine. An adult aged 79 years or older who has certain medical conditions and previously received 1 or more doses of PPSV23 vaccine should receive 1 dose of PCV13. The PCV13 vaccine dose should be obtained 1 or more years after the last PPSV23 vaccine dose.  Pneumococcal polysaccharide (PPSV23) vaccine. When PCV13 is also indicated, PCV13 should be obtained first. All adults aged 74 years and older should be immunized. An adult younger than age 50 years who has certain medical conditions should be immunized. Any person who resides in a nursing home or long-term care facility should be immunized. An adult smoker should be immunized. People with an immunocompromised condition and certain other conditions should receive both PCV13 and PPSV23 vaccines. People with human  immunodeficiency virus (HIV) infection should be immunized as soon as possible after diagnosis. Immunization during chemotherapy or radiation therapy should be avoided. Routine use of PPSV23 vaccine is not recommended for American Indians, [place] Natives, or people younger than 65 years unless there are medical conditions that require PPSV23 vaccine. When indicated, people who have unknown immunization and have no record of immunization should receive PPSV23 vaccine. One-time revaccination 5 years after the first dose of PPSV23 is recommended for people aged 41 64 years who have chronic kidney failure, nephrotic syndrome, asplenia, or immunocompromised conditions. People who received 1 2 doses of PPSV23 before age 15 years should receive another dose of PPSV23 vaccine at age 48 years or later if at least 5 years have passed since the previous dose. Doses of PPSV23 are not needed for people immunized with PPSV23 at or after age 69 years.  Meningococcal vaccine. Adults with asplenia or persistent complement component deficiencies should receive 2 doses of quadrivalent meningococcal conjugate (MenACWY-D) vaccine. The doses should be obtained at least 2 months apart. Microbiologists working with certain meningococcal bacteria, [place] recruits, people at risk during an outbreak, and people who travel to or live in countries with a high rate of meningitis should be immunized. A first-year college student up through age 7 years who is living in a residence hall should receive a dose if he did not receive a dose on or after his 16th birthday. Adults who have certain high-risk conditions should receive one or more doses of vaccine.  Hepatitis A vaccine. Adults who wish to be protected from this disease, have certain high-risk conditions, work with hepatitis A-infected animals, work in hepatitis A research labs, or travel to or work in countries with a high rate of hepatitis A should be immunized. Adults who were  previously unvaccinated and who anticipate close contact with an international adoptee during the first 60 days after arrival in the [place] from a country with a high rate of hepatitis A should be immunized.  Hepatitis B vaccine. Adults who wish to be protected from this disease, have certain high-risk conditions, may be exposed to blood or other infectious body fluids, are household contacts or sex partners of hepatitis B positive people, are clients or workers in certain care facilities, or travel to or work in countries with a high rate of hepatitis B should be immunized.  Haemophilus influenzae type b (Hib) vaccine. A  previously unvaccinated person with asplenia or sickle cell disease or having a scheduled splenectomy should receive 1 dose of Hib vaccine. Regardless of previous immunization, a recipient of a hematopoietic stem cell transplant should receive a 3-dose series 6 12 months after his successful transplant. Hib vaccine is not recommended for adults with HIV infection. Preventive Service / Frequency Ages 62 to 3  Blood pressure check.** / Every 1 to 2 years.  Lipid and cholesterol check.** / Every 5 years beginning at age 43.  Hepatitis C blood test.** / For any individual with known risks for hepatitis C.  Skin self-exam. / Monthly.  Influenza vaccine. / Every year.  Tetanus, diphtheria, and acellular pertussis (Tdap, Td) vaccine.** / Consult your health care provider. 1 dose of Td every 10 years.  Varicella vaccine.** / Consult your health care provider.  HPV vaccine. / 3 doses over 6 months, if 48 or younger.  Measles, mumps, rubella (MMR) vaccine.** / You need at least 1 dose of MMR if you were born in 1957 or later. You may also need a second dose.  Pneumococcal 13-valent conjugate (PCV13) vaccine.** / Consult your health care provider.  Pneumococcal polysaccharide (PPSV23) vaccine.** / 1 to 2 doses if you smoke cigarettes or if you have certain  conditions.  Meningococcal vaccine.** / 1 dose if you are age 8 to 70 years and a Market researcher living in a residence hall, or have one of several medical conditions. You may also need additional booster doses.  Hepatitis A vaccine.** / Consult your health care provider.  Hepatitis B vaccine.** / Consult your health care provider.  Haemophilus influenzae type b (Hib) vaccine.** / Consult your health care provider. Ages 48 to 32  Blood pressure check.** / Every 1 to 2 years.  Lipid and cholesterol check.** / Every 5 years beginning at age 38.  Lung cancer screening. / Every year if you are aged 40 80 years and have a 30-pack-year history of smoking and currently smoke or have quit within the past 15 years. Yearly screening is stopped once you have quit smoking for at least 15 years or develop a health problem that would prevent you from having lung cancer treatment.  Fecal occult blood test (FOBT) of stool. / Every year beginning at age 4 and continuing until age 70. You may not have to do this test if you get a colonoscopy every 10 years.  Flexible sigmoidoscopy** or colonoscopy.** / Every 5 years for a flexible sigmoidoscopy or every 10 years for a colonoscopy beginning at age 76 and continuing until age 62.  Hepatitis C blood test.** / For all people born from [birth year] through 1965 and any individual with known risks for hepatitis C.  Skin self-exam. / Monthly.  Influenza vaccine. / Every year.  Tetanus, diphtheria, and acellular pertussis (Tdap/Td) vaccine.** / Consult your health care provider. 1 dose of Td every 10 years.  Varicella vaccine.** / Consult your health care provider.  Zoster vaccine.** / 1 dose for adults aged 60 years or older.  Measles, mumps, rubella (MMR) vaccine.** / You need at least 1 dose of MMR if you were born in 1957 or later. You may also need a second dose.  Pneumococcal 13-valent conjugate (PCV13) vaccine.** / Consult your health care  provider.  Pneumococcal polysaccharide (PPSV23) vaccine.** / 1 to 2 doses if you smoke cigarettes or if you have certain conditions.  Meningococcal vaccine.** / Consult your health care provider.  Hepatitis A vaccine.** / Consult your health care  provider.  Hepatitis B vaccine.** / Consult your health care provider.  Haemophilus influenzae type b (Hib) vaccine.** / Consult your health care provider. Ages 65 and over  Blood pressure check.** / Every 1 to 2 years.  Lipid and cholesterol check.**/ Every 5 years beginning at age 20.  Lung cancer screening. / Every year if you are aged 55 80 years and have a 30-pack-year history of smoking and currently smoke or have quit within the past 15 years. Yearly screening is stopped once you have quit smoking for at least 15 years or develop a health problem that would prevent you from having lung cancer treatment.  Fecal occult blood test (FOBT) of stool. / Every year beginning at age 50 and continuing until age 75. You may not have to do this test if you get a colonoscopy every 10 years.  Flexible sigmoidoscopy** or colonoscopy.** / Every 5 years for a flexible sigmoidoscopy or every 10 years for a colonoscopy beginning at age 50 and continuing until age 75.  Hepatitis C blood test.** / For all people born from 1945 through 1965 and any individual with known risks for hepatitis C.  Abdominal aortic aneurysm (AAA) screening.** / A one-time screening for ages 65 to 75 years who are current or former smokers.  Skin self-exam. / Monthly.  Influenza vaccine. / Every year.  Tetanus, diphtheria, and acellular pertussis (Tdap/Td) vaccine.** / 1 dose of Td every 10 years.  Varicella vaccine.** / Consult your health care provider.  Zoster vaccine.** / 1 dose for adults aged 60 years or older.  Pneumococcal 13-valent conjugate (PCV13) vaccine.** / Consult your health care provider.  Pneumococcal polysaccharide (PPSV23) vaccine.** / 1 dose for all  adults aged 65 years and older.  Meningococcal vaccine.** / Consult your health care provider.  Hepatitis A vaccine.** / Consult your health care provider.  Hepatitis B vaccine.** / Consult your health care provider.  Haemophilus influenzae type b (Hib) vaccine.** / Consult your health care provider. **Family history and personal history of risk and conditions may change your health care provider's recommendations. Document Released: 10/10/2001 Document Revised: 06/04/2013 Document Reviewed: 01/09/2011 [employer] Patient Information 2014 [employer].  

## 2014-02-06 NOTE — Progress Notes (Signed)
Subjective:    Patient ID: Thomas Levy, male    DOB: 06-25-1956, 58 y.o.   MRN: [id number]  HPI Pt here for cpe and labs.  No complaints.   Review of Systems  Constitutional: Negative for diaphoresis, appetite change, fatigue and unexpected weight change.  Eyes: Negative for pain, redness and visual disturbance.  Respiratory: Negative for cough, chest tightness, shortness of breath and wheezing.   Cardiovascular: Negative for chest pain, palpitations and leg swelling.  Endocrine: Negative for cold intolerance, heat intolerance, polydipsia, polyphagia and polyuria.  Genitourinary: Negative for dysuria, frequency and difficulty urinating.  Neurological: Negative for dizziness, light-headedness, numbness and headaches.   Past Medical History  Diagnosis Date  . Hypertension   . Hyperlipidemia    Family History  Problem Relation Age of Onset  . Hypertension Mother   . Cancer Mother     breast  . Kidney disease Father     kidney disease-- on dialysis  . Hypertension Brother   . Hyperlipidemia Brother    History   Social History  . Marital Status: Married    Spouse Name: N/A    Number of Children: N/A  . Years of Education: N/A   Occupational History  . Not on file.   Social History Main Topics  . Smoking status: Never Smoker   . Smokeless tobacco: Not on file  . Alcohol Use: 4.2 oz/week    7 Glasses of wine per week  . Drug Use: No  . Sexual Activity: Yes    Partners: Female   Other Topics Concern  . Not on file   Social History Narrative   Gym-- 4 days a week for 1 hour   Past Surgical History  Procedure Laterality Date  . Hernia repair    . Vasectomy        Objective:   Physical Exam  Constitutional: He is oriented to person, place, and time. Vital signs are normal. He appears well-developed and well-nourished. He is sleeping.  HENT:  Head: Normocephalic and atraumatic.  Mouth/Throat: Oropharynx is clear and moist.  Eyes: EOM are normal. Pupils  are equal, round, and reactive to light.  Neck: Normal range of motion. Neck supple. No thyromegaly present.  Cardiovascular: Normal rate and regular rhythm.   No murmur heard. Pulmonary/Chest: Effort normal and breath sounds normal. No respiratory distress. He has no wheezes. He has no rales. He exhibits no tenderness.  Musculoskeletal: He exhibits no edema and no tenderness.  Neurological: He is alert and oriented to person, place, and time.  Skin: Skin is warm and dry.  Psychiatric: He has a normal mood and affect. His behavior is normal. Judgment and thought content normal.          Assessment & Plan:  1. Other and unspecified hyperlipidemia Check labs - Hepatic function panel - Lipid panel - atorvastatin (LIPITOR) 20 MG tablet; 1 po qhs  Dispense: 90 tablet; Refill: 3  2. Gout   - Basic metabolic panel - Uric acid - allopurinol (ZYLOPRIM) 300 MG tablet; TAKE 1 TABLET (300 MG TOTAL) BY MOUTH DAILY.  Dispense: 90 tablet; Refill: 3  3. Preventative health care  - Basic metabolic panel - CBC with Differential - Hepatic function panel - Lipid panel - POCT urinalysis dipstick - TSH - PSA - Uric acid  4. Erectile dysfunction   - sildenafil (VIAGRA) 100 MG tablet; Take 0.5-1 tablets (50-100 mg total) by mouth daily as needed for erectile dysfunction.  Dispense: 10 tablet; Refill: 5  5. HTN (hypertension) stable - lisinopril-hydrochlorothiazide (PRINZIDE,ZESTORETIC) 20-12.5 MG per tablet; 2 TABLETS BY MOUTH DAILY-  Dispense: 180 tablet; Refill: 0  6. Tinea cruris  - nystatin cream (MYCOSTATIN); Apply 1 application topically 2 (two) times daily.  Dispense: 30 g; Refill: 0

## 2014-02-06 NOTE — Progress Notes (Signed)
Pre visit review using our clinic review tool, if applicable. No additional management support is needed unless otherwise documented below in the visit note. 

## 2014-02-07 ENCOUNTER — Telehealth: Payer: Self-pay | Admitting: Family Medicine

## 2014-02-07 NOTE — Telephone Encounter (Signed)
Relevant patient education assigned to patient using Emmi. ° °

## 2014-02-11 LAB — POCT URINALYSIS DIPSTICK
BILIRUBIN UA: NEGATIVE
Glucose, UA: NEGATIVE
KETONES UA: NEGATIVE
Leukocytes, UA: NEGATIVE
Nitrite, UA: NEGATIVE
Protein, UA: NEGATIVE
RBC UA: NEGATIVE
Spec Grav, UA: <=1.005
UROBILINOGEN UA: 0.2
pH, UA: 5

## 2014-02-20 ENCOUNTER — Other Ambulatory Visit: Payer: Self-pay | Admitting: Family Medicine

## 2014-02-20 ENCOUNTER — Telehealth: Payer: Self-pay | Admitting: *Deleted

## 2014-02-20 DIAGNOSIS — B354 Tinea corporis: Secondary | ICD-10-CM

## 2014-02-20 MED ORDER — NAFTIFINE HCL 1 % EX CREA: TOPICAL_CREAM | Freq: Every day | CUTANEOUS | Status: DC

## 2014-02-20 NOTE — Telephone Encounter (Signed)
sent 

## 2014-02-20 NOTE — Telephone Encounter (Signed)
Please advise      KP 

## 2014-02-20 NOTE — Telephone Encounter (Signed)
Message left advising Rx faxed     KP

## 2014-02-20 NOTE — Telephone Encounter (Signed)
Caller name:  Dr. Teddy Spike Relation to pt:  self Call back number: [phone]  Pharmacy:  [facility]  Reason for call:   Pt called and stated he has been using the nystatin cream (MYCOSTATIN) for 2 weeks with mild results.  Pt asks if you will send prescription in for Naftin?  Please advise.  bw

## 2014-05-21 ENCOUNTER — Other Ambulatory Visit: Payer: Self-pay

## 2014-05-21 ENCOUNTER — Encounter: Payer: Self-pay | Admitting: Family Medicine

## 2014-05-21 MED ORDER — FLUCONAZOLE 100 MG PO TABS: 100.0000 mg | ORAL_TABLET | Freq: Every day | ORAL | Status: DC

## 2014-05-21 MED ORDER — CLOTRIMAZOLE-BETAMETHASONE 1-0.05 % EX LOTN: TOPICAL_LOTION | Freq: Two times a day (BID) | CUTANEOUS | Status: DC

## 2014-05-21 NOTE — Telephone Encounter (Signed)
Diflucan 100 mg 1 po qd x 14 days and lotrisone apply bid x 2 weeks  30 g

## 2014-05-27 ENCOUNTER — Other Ambulatory Visit: Payer: Self-pay

## 2014-05-27 MED ORDER — LISINOPRIL-HYDROCHLOROTHIAZIDE 20-12.5 MG PO TABS: ORAL_TABLET | ORAL | Status: DC

## 2014-06-03 NOTE — Telephone Encounter (Signed)
Pt needs to be seen. Already treated agressively so needs to be seen.

## 2014-06-12 ENCOUNTER — Other Ambulatory Visit: Payer: Self-pay

## 2014-11-30 ENCOUNTER — Other Ambulatory Visit: Payer: Self-pay

## 2014-11-30 MED ORDER — LISINOPRIL-HYDROCHLOROTHIAZIDE 20-12.5 MG PO TABS: ORAL_TABLET | ORAL | Status: DC

## 2015-02-01 ENCOUNTER — Other Ambulatory Visit: Payer: Self-pay | Admitting: Family Medicine

## 2015-02-01 ENCOUNTER — Encounter: Payer: Self-pay | Admitting: Family Medicine

## 2015-02-01 DIAGNOSIS — J9801 Acute bronchospasm: Secondary | ICD-10-CM

## 2015-02-01 MED ORDER — ALBUTEROL SULFATE HFA 108 (90 BASE) MCG/ACT IN AERS: 2.0000 | INHALATION_SPRAY | Freq: Four times a day (QID) | RESPIRATORY_TRACT | Status: DC | PRN

## 2015-02-01 NOTE — Telephone Encounter (Signed)
I sent it to [address] because that is pharmacy they had in chart-- did they really want [facility]?

## 2015-02-02 NOTE — Telephone Encounter (Signed)
Msg left advising Rx sent to [facility] and to call if any concerns.      KP

## 2015-02-05 ENCOUNTER — Encounter: Payer: Self-pay | Admitting: Family Medicine

## 2015-02-05 DIAGNOSIS — N529 Male erectile dysfunction, unspecified: Secondary | ICD-10-CM

## 2015-02-08 MED ORDER — SILDENAFIL CITRATE 100 MG PO TABS: 50.0000 mg | ORAL_TABLET | Freq: Every day | ORAL | Status: DC | PRN

## 2015-02-08 NOTE — Telephone Encounter (Signed)
We can do #50 but if I'm not mistaken they only have a 20 mg available

## 2015-02-12 ENCOUNTER — Encounter: Payer: BC Managed Care – PPO | Admitting: Family Medicine

## 2015-02-19 ENCOUNTER — Encounter: Payer: Self-pay | Admitting: Family Medicine

## 2015-03-02 ENCOUNTER — Telehealth: Payer: Self-pay | Admitting: Family Medicine

## 2015-03-02 NOTE — Telephone Encounter (Signed)
pre visit letter mailed 02/26/15

## 2015-03-19 ENCOUNTER — Encounter: Payer: Self-pay | Admitting: Family Medicine

## 2015-03-29 ENCOUNTER — Other Ambulatory Visit: Payer: Self-pay

## 2015-03-29 DIAGNOSIS — M109 Gout, unspecified: Secondary | ICD-10-CM

## 2015-03-29 DIAGNOSIS — E785 Hyperlipidemia, unspecified: Secondary | ICD-10-CM

## 2015-03-29 MED ORDER — ALLOPURINOL 300 MG PO TABS: ORAL_TABLET | ORAL | Status: DC

## 2015-03-29 MED ORDER — ATORVASTATIN CALCIUM 20 MG PO TABS: ORAL_TABLET | ORAL | Status: DC

## 2015-04-14 ENCOUNTER — Telehealth: Payer: Self-pay | Admitting: Family Medicine

## 2015-04-14 NOTE — Telephone Encounter (Signed)
pre visit letter mailed 04/08/15 °

## 2015-04-28 ENCOUNTER — Telehealth: Payer: Self-pay | Admitting: *Deleted

## 2015-04-28 NOTE — Telephone Encounter (Signed)
Unable to reach patient at time of Pre-Visit Call.  Left message for patient to return call when available.    

## 2015-04-29 ENCOUNTER — Ambulatory Visit (INDEPENDENT_AMBULATORY_CARE_PROVIDER_SITE_OTHER): Payer: BLUE CROSS/BLUE SHIELD | Admitting: Family Medicine

## 2015-04-29 ENCOUNTER — Encounter: Payer: Self-pay | Admitting: Family Medicine

## 2015-04-29 VITALS — BP 108/64 | HR 62 | Temp 98.1°F | Ht 67.0 in | Wt 148.0 lb

## 2015-04-29 DIAGNOSIS — E785 Hyperlipidemia, unspecified: Secondary | ICD-10-CM

## 2015-04-29 DIAGNOSIS — I10 Essential (primary) hypertension: Secondary | ICD-10-CM

## 2015-04-29 DIAGNOSIS — Z23 Encounter for immunization: Secondary | ICD-10-CM | POA: Diagnosis not present

## 2015-04-29 DIAGNOSIS — Z0000 Encounter for general adult medical examination without abnormal findings: Secondary | ICD-10-CM | POA: Diagnosis not present

## 2015-04-29 NOTE — Progress Notes (Signed)
Pre visit review using our clinic review tool, if applicable. No additional management support is needed unless otherwise documented below in the visit note. 

## 2015-04-29 NOTE — Patient Instructions (Signed)
Preventive Care for Adults A healthy lifestyle and preventive care can promote health and wellness. Preventive health guidelines for men include the following key practices:  A routine yearly physical is a good way to check with your health care provider about your health and preventative screening. It is a chance to share any concerns and updates on your health and to receive a thorough exam.  Visit your dentist for a routine exam and preventative care every 6 months. Brush your teeth twice a day and floss once a day. Good oral hygiene prevents tooth decay and gum disease.  The frequency of eye exams is based on your age, health, family medical history, use of contact lenses, and other factors. Follow your health care provider's recommendations for frequency of eye exams.  Eat a healthy diet. Foods such as vegetables, fruits, whole grains, low-fat dairy products, and lean protein foods contain the nutrients you need without too many calories. Decrease your intake of foods high in solid fats, added sugars, and salt. Eat the right amount of calories for you.Get information about a proper diet from your health care provider, if necessary.  Regular physical exercise is one of the most important things you can do for your health. Most adults should get at least 150 minutes of moderate-intensity exercise (any activity that increases your heart rate and causes you to sweat) each week. In addition, most adults need muscle-strengthening exercises on 2 or more days a week.  Maintain a healthy weight. The body mass index (BMI) is a screening tool to identify possible weight problems. It provides an estimate of body fat based on height and weight. Your health care provider can find your BMI and can help you achieve or maintain a healthy weight.For adults 20 years and older:  A BMI below 18.5 is considered underweight.  A BMI of 18.5 to 24.9 is normal.  A BMI of 25 to 29.9 is considered overweight.  A BMI  of 30 and above is considered obese.  Maintain normal blood lipids and cholesterol levels by exercising and minimizing your intake of saturated fat. Eat a balanced diet with plenty of fruit and vegetables. Blood tests for lipids and cholesterol should begin at age 50 and be repeated every 5 years. If your lipid or cholesterol levels are high, you are over 50, or you are at high risk for heart disease, you may need your cholesterol levels checked more frequently.Ongoing high lipid and cholesterol levels should be treated with medicines if diet and exercise are not working.  If you smoke, find out from your health care provider how to quit. If you do not use tobacco, do not start.  Lung cancer screening is recommended for adults aged 73-80 years who are at high risk for developing lung cancer because of a history of smoking. A yearly low-dose CT scan of the lungs is recommended for people who have at least a 30-pack-year history of smoking and are a current smoker or have quit within the past 15 years. A pack year of smoking is smoking an average of 1 pack of cigarettes a day for 1 year (for example: 1 pack a day for 30 years or 2 packs a day for 15 years). Yearly screening should continue until the smoker has stopped smoking for at least 15 years. Yearly screening should be stopped for people who develop a health problem that would prevent them from having lung cancer treatment.  If you choose to drink alcohol, do not have more than  2 drinks per day. One drink is considered to be 12 ounces (355 mL) of beer, 5 ounces (148 mL) of wine, or 1.5 ounces (44 mL) of liquor.  Avoid use of street drugs. Do not share needles with anyone. Ask for help if you need support or instructions about stopping the use of drugs.  High blood pressure causes heart disease and increases the risk of stroke. Your blood pressure should be checked at least every 1-2 years. Ongoing high blood pressure should be treated with  medicines, if weight loss and exercise are not effective.  If you are 45-79 years old, ask your health care provider if you should take aspirin to prevent heart disease.  Diabetes screening involves taking a blood sample to check your fasting blood sugar level. This should be done once every 3 years, after age 45, if you are within normal weight and without risk factors for diabetes. Testing should be considered at a younger age or be carried out more frequently if you are overweight and have at least 1 risk factor for diabetes.  Colorectal cancer can be detected and often prevented. Most routine colorectal cancer screening begins at the age of 50 and continues through age 75. However, your health care provider may recommend screening at an earlier age if you have risk factors for colon cancer. On a yearly basis, your health care provider may provide home test kits to check for hidden blood in the stool. Use of a small camera at the end of a tube to directly examine the colon (sigmoidoscopy or colonoscopy) can detect the earliest forms of colorectal cancer. Talk to your health care provider about this at age 50, when routine screening begins. Direct exam of the colon should be repeated every 5-10 years through age 75, unless early forms of precancerous polyps or small growths are found.  People who are at an increased risk for hepatitis B should be screened for this virus. You are considered at high risk for hepatitis B if:  You were born in a country where hepatitis B occurs often. Talk with your health care provider about which countries are considered high risk.  Your parents were born in a high-risk country and you have not received a shot to protect against hepatitis B (hepatitis B vaccine).  You have HIV or AIDS.  You use needles to inject street drugs.  You live with, or have sex with, someone who has hepatitis B.  You are a man who has sex with other men (MSM).  You get hemodialysis  treatment.  You take certain medicines for conditions such as cancer, organ transplantation, and autoimmune conditions.  Hepatitis C blood testing is recommended for all people born from 1945 through 1965 and any individual with known risks for hepatitis C.  Practice safe sex. Use condoms and avoid high-risk sexual practices to reduce the spread of sexually transmitted infections (STIs). STIs include gonorrhea, chlamydia, syphilis, trichomonas, herpes, HPV, and human immunodeficiency virus (HIV). Herpes, HIV, and HPV are viral illnesses that have no cure. They can result in disability, cancer, and death.  If you are at risk of being infected with HIV, it is recommended that you take a prescription medicine daily to prevent HIV infection. This is called preexposure prophylaxis (PrEP). You are considered at risk if:  You are a man who has sex with other men (MSM) and have other risk factors.  You are a heterosexual man, are sexually active, and are at increased risk for HIV infection.    You take drugs by injection.  You are sexually active with a partner who has HIV.  Talk with your health care provider about whether you are at high risk of being infected with HIV. If you choose to begin PrEP, you should first be tested for HIV. You should then be tested every 3 months for as long as you are taking PrEP.  A one-time screening for abdominal aortic aneurysm (AAA) and surgical repair of large AAAs by ultrasound are recommended for men ages 32 to 67 years who are current or former smokers.  Healthy men should no longer receive prostate-specific antigen (PSA) blood tests as part of routine cancer screening. Talk with your health care provider about prostate cancer screening.  Testicular cancer screening is not recommended for adult males who have no symptoms. Screening includes self-exam, a health care provider exam, and other screening tests. Consult with your health care provider about any symptoms  you have or any concerns you have about testicular cancer.  Use sunscreen. Apply sunscreen liberally and repeatedly throughout the day. You should seek shade when your shadow is shorter than you. Protect yourself by wearing long sleeves, pants, a wide-brimmed hat, and sunglasses year round, whenever you are outdoors.  Once a month, do a whole-body skin exam, using a mirror to look at the skin on your back. Tell your health care provider about new moles, moles that have irregular borders, moles that are larger than a pencil eraser, or moles that have changed in shape or color.  Stay current with required vaccines (immunizations).  Influenza vaccine. All adults should be immunized every year.  Tetanus, diphtheria, and acellular pertussis (Td, Tdap) vaccine. An adult who has not previously received Tdap or who does not know his vaccine status should receive 1 dose of Tdap. This initial dose should be followed by tetanus and diphtheria toxoids (Td) booster doses every 10 years. Adults with an unknown or incomplete history of completing a 3-dose immunization series with Td-containing vaccines should begin or complete a primary immunization series including a Tdap dose. Adults should receive a Td booster every 10 years.  Varicella vaccine. An adult without evidence of immunity to varicella should receive 2 doses or a second dose if he has previously received 1 dose.  Human papillomavirus (HPV) vaccine. Males aged 68-21 years who have not received the vaccine previously should receive the 3-dose series. Males aged 22-26 years may be immunized. Immunization is recommended through the age of 6 years for any male who has sex with males and did not get any or all doses earlier. Immunization is recommended for any person with an immunocompromised condition through the age of 49 years if he did not get any or all doses earlier. During the 3-dose series, the second dose should be obtained 4-8 weeks after the first  dose. The third dose should be obtained 24 weeks after the first dose and 16 weeks after the second dose.  Zoster vaccine. One dose is recommended for adults aged 50 years or older unless certain conditions are present.  Measles, mumps, and rubella (MMR) vaccine. Adults born before [birth year] generally are considered immune to measles and mumps. Adults born in [birth year] or later should have 1 or more doses of MMR vaccine unless there is a contraindication to the vaccine or there is laboratory evidence of immunity to each of the three diseases. A routine second dose of MMR vaccine should be obtained at least 28 days after the first dose for students attending postsecondary  schools, health care workers, or international travelers. People who received inactivated measles vaccine or an unknown type of measles vaccine during 1963-1967 should receive 2 doses of MMR vaccine. People who received inactivated mumps vaccine or an unknown type of mumps vaccine before 1979 and are at high risk for mumps infection should consider immunization with 2 doses of MMR vaccine. Unvaccinated health care workers born before 1957 who lack laboratory evidence of measles, mumps, or rubella immunity or laboratory confirmation of disease should consider measles and mumps immunization with 2 doses of MMR vaccine or rubella immunization with 1 dose of MMR vaccine.  Pneumococcal 13-valent conjugate (PCV13) vaccine. When indicated, a person who is uncertain of his immunization history and has no record of immunization should receive the PCV13 vaccine. An adult aged 19 years or older who has certain medical conditions and has not been previously immunized should receive 1 dose of PCV13 vaccine. This PCV13 should be followed with a dose of pneumococcal polysaccharide (PPSV23) vaccine. The PPSV23 vaccine dose should be obtained at least 8 weeks after the dose of PCV13 vaccine. An adult aged 19 years or older who has certain medical conditions and  previously received 1 or more doses of PPSV23 vaccine should receive 1 dose of PCV13. The PCV13 vaccine dose should be obtained 1 or more years after the last PPSV23 vaccine dose.  Pneumococcal polysaccharide (PPSV23) vaccine. When PCV13 is also indicated, PCV13 should be obtained first. All adults aged 65 years and older should be immunized. An adult younger than age 65 years who has certain medical conditions should be immunized. Any person who resides in a nursing home or long-term care facility should be immunized. An adult smoker should be immunized. People with an immunocompromised condition and certain other conditions should receive both PCV13 and PPSV23 vaccines. People with human immunodeficiency virus (HIV) infection should be immunized as soon as possible after diagnosis. Immunization during chemotherapy or radiation therapy should be avoided. Routine use of PPSV23 vaccine is not recommended for American Indians, [place] Natives, or people younger than 65 years unless there are medical conditions that require PPSV23 vaccine. When indicated, people who have unknown immunization and have no record of immunization should receive PPSV23 vaccine. One-time revaccination 5 years after the first dose of PPSV23 is recommended for people aged 19-64 years who have chronic kidney failure, nephrotic syndrome, asplenia, or immunocompromised conditions. People who received 1-2 doses of PPSV23 before age 65 years should receive another dose of PPSV23 vaccine at age 65 years or later if at least 5 years have passed since the previous dose. Doses of PPSV23 are not needed for people immunized with PPSV23 at or after age 65 years.  Meningococcal vaccine. Adults with asplenia or persistent complement component deficiencies should receive 2 doses of quadrivalent meningococcal conjugate (MenACWY-D) vaccine. The doses should be obtained at least 2 months apart. Microbiologists working with certain meningococcal bacteria,  military recruits, people at risk during an outbreak, and people who travel to or live in countries with a high rate of meningitis should be immunized. A first-year college student up through age 21 years who is living in a residence hall should receive a dose if he did not receive a dose on or after his 16th birthday. Adults who have certain high-risk conditions should receive one or more doses of vaccine.  Hepatitis A vaccine. Adults who wish to be protected from this disease, have certain high-risk conditions, work with hepatitis A-infected animals, work in hepatitis A research labs, or   travel to or work in countries with a high rate of hepatitis A should be immunized. Adults who were previously unvaccinated and who anticipate close contact with an international adoptee during the first 60 days after arrival in the [place] from a country with a high rate of hepatitis A should be immunized.  Hepatitis B vaccine. Adults should be immunized if they wish to be protected from this disease, have certain high-risk conditions, may be exposed to blood or other infectious body fluids, are household contacts or sex partners of hepatitis B positive people, are clients or workers in certain care facilities, or travel to or work in countries with a high rate of hepatitis B.  Haemophilus influenzae type b (Hib) vaccine. A previously unvaccinated person with asplenia or sickle cell disease or having a scheduled splenectomy should receive 1 dose of Hib vaccine. Regardless of previous immunization, a recipient of a hematopoietic stem cell transplant should receive a 3-dose series 6-12 months after his successful transplant. Hib vaccine is not recommended for adults with HIV infection. Preventive Service / Frequency Ages 52 to 17  Blood pressure check.** / Every 1 to 2 years.  Lipid and cholesterol check.** / Every 5 years beginning at age 69.  Hepatitis C blood test.** / For any individual with known risks for  hepatitis C.  Skin self-exam. / Monthly.  Influenza vaccine. / Every year.  Tetanus, diphtheria, and acellular pertussis (Tdap, Td) vaccine.** / Consult your health care provider. 1 dose of Td every 10 years.  Varicella vaccine.** / Consult your health care provider.  HPV vaccine. / 3 doses over 6 months, if 72 or younger.  Measles, mumps, rubella (MMR) vaccine.** / You need at least 1 dose of MMR if you were born in 1957 or later. You may also need a second dose.  Pneumococcal 13-valent conjugate (PCV13) vaccine.** / Consult your health care provider.  Pneumococcal polysaccharide (PPSV23) vaccine.** / 1 to 2 doses if you smoke cigarettes or if you have certain conditions.  Meningococcal vaccine.** / 1 dose if you are age 35 to 60 years and a Market researcher living in a residence hall, or have one of several medical conditions. You may also need additional booster doses.  Hepatitis A vaccine.** / Consult your health care provider.  Hepatitis B vaccine.** / Consult your health care provider.  Haemophilus influenzae type b (Hib) vaccine.** / Consult your health care provider. Ages 35 to 8  Blood pressure check.** / Every 1 to 2 years.  Lipid and cholesterol check.** / Every 5 years beginning at age 57.  Lung cancer screening. / Every year if you are aged 44-80 years and have a 30-pack-year history of smoking and currently smoke or have quit within the past 15 years. Yearly screening is stopped once you have quit smoking for at least 15 years or develop a health problem that would prevent you from having lung cancer treatment.  Fecal occult blood test (FOBT) of stool. / Every year beginning at age 55 and continuing until age 73. You may not have to do this test if you get a colonoscopy every 10 years.  Flexible sigmoidoscopy** or colonoscopy.** / Every 5 years for a flexible sigmoidoscopy or every 10 years for a colonoscopy beginning at age 28 and continuing until age  1.  Hepatitis C blood test.** / For all people born from [birth year] through 1965 and any individual with known risks for hepatitis C.  Skin self-exam. / Monthly.  Influenza vaccine. / Every  year.  Tetanus, diphtheria, and acellular pertussis (Tdap/Td) vaccine.** / Consult your health care provider. 1 dose of Td every 10 years.  Varicella vaccine.** / Consult your health care provider.  Zoster vaccine.** / 1 dose for adults aged 53 years or older.  Measles, mumps, rubella (MMR) vaccine.** / You need at least 1 dose of MMR if you were born in 1957 or later. You may also need a second dose.  Pneumococcal 13-valent conjugate (PCV13) vaccine.** / Consult your health care provider.  Pneumococcal polysaccharide (PPSV23) vaccine.** / 1 to 2 doses if you smoke cigarettes or if you have certain conditions.  Meningococcal vaccine.** / Consult your health care provider.  Hepatitis A vaccine.** / Consult your health care provider.  Hepatitis B vaccine.** / Consult your health care provider.  Haemophilus influenzae type b (Hib) vaccine.** / Consult your health care provider. Ages 77 and over  Blood pressure check.** / Every 1 to 2 years.  Lipid and cholesterol check.**/ Every 5 years beginning at age 85.  Lung cancer screening. / Every year if you are aged 55-80 years and have a 30-pack-year history of smoking and currently smoke or have quit within the past 15 years. Yearly screening is stopped once you have quit smoking for at least 15 years or develop a health problem that would prevent you from having lung cancer treatment.  Fecal occult blood test (FOBT) of stool. / Every year beginning at age 33 and continuing until age 11. You may not have to do this test if you get a colonoscopy every 10 years.  Flexible sigmoidoscopy** or colonoscopy.** / Every 5 years for a flexible sigmoidoscopy or every 10 years for a colonoscopy beginning at age 28 and continuing until age 73.  Hepatitis C blood  test.** / For all people born from [birth year] through 1965 and any individual with known risks for hepatitis C.  Abdominal aortic aneurysm (AAA) screening.** / A one-time screening for ages 50 to 27 years who are current or former smokers.  Skin self-exam. / Monthly.  Influenza vaccine. / Every year.  Tetanus, diphtheria, and acellular pertussis (Tdap/Td) vaccine.** / 1 dose of Td every 10 years.  Varicella vaccine.** / Consult your health care provider.  Zoster vaccine.** / 1 dose for adults aged 34 years or older.  Pneumococcal 13-valent conjugate (PCV13) vaccine.** / Consult your health care provider.  Pneumococcal polysaccharide (PPSV23) vaccine.** / 1 dose for all adults aged 63 years and older.  Meningococcal vaccine.** / Consult your health care provider.  Hepatitis A vaccine.** / Consult your health care provider.  Hepatitis B vaccine.** / Consult your health care provider.  Haemophilus influenzae type b (Hib) vaccine.** / Consult your health care provider. **Family history and personal history of risk and conditions may change your health care provider's recommendations. Document Released: 10/10/2001 Document Revised: 08/19/2013 Document Reviewed: 01/09/2011 [facility] Patient Information 2015 [place], [place]. This information is not intended to replace advice given to you by your health care provider. Make sure you discuss any questions you have with your health care provider.

## 2015-04-30 DIAGNOSIS — Z0000 Encounter for general adult medical examination without abnormal findings: Secondary | ICD-10-CM | POA: Insufficient documentation

## 2015-04-30 LAB — CBC WITH DIFFERENTIAL/PLATELET
Basophils Absolute: 0 10*3/uL (ref 0.0–0.1)
Basophils Relative: 0.4 % (ref 0.0–3.0)
EOS PCT: 3 % (ref 0.0–5.0)
Eosinophils Absolute: 0.2 10*3/uL (ref 0.0–0.7)
HCT: 44.2 % (ref 39.0–52.0)
HEMOGLOBIN: 14.9 g/dL (ref 13.0–17.0)
Lymphocytes Relative: 29.7 % (ref 12.0–46.0)
Lymphs Abs: 1.6 10*3/uL (ref 0.7–4.0)
MCHC: 33.7 g/dL (ref 30.0–36.0)
MCV: 96.2 fl (ref 78.0–100.0)
Monocytes Absolute: 0.4 10*3/uL (ref 0.1–1.0)
Monocytes Relative: 7.6 % (ref 3.0–12.0)
Neutro Abs: 3.2 10*3/uL (ref 1.4–7.7)
Neutrophils Relative %: 59.3 % (ref 43.0–77.0)
Platelets: 197 10*3/uL (ref 150.0–400.0)
RBC: 4.59 Mil/uL (ref 4.22–5.81)
RDW: 13.3 % (ref 11.5–15.5)
WBC: 5.5 10*3/uL (ref 4.0–10.5)

## 2015-04-30 LAB — BASIC METABOLIC PANEL
BUN: 17 mg/dL (ref 6–23)
CHLORIDE: 102 meq/L (ref 96–112)
CO2: 29 mEq/L (ref 19–32)
Calcium: 9.9 mg/dL (ref 8.4–10.5)
Creatinine, Ser: 0.95 mg/dL (ref 0.40–1.50)
GFR: 85.92 mL/min (ref >60.00)
GLUCOSE: 89 mg/dL (ref 70–99)
POTASSIUM: 3.7 meq/L (ref 3.5–5.1)
Sodium: 142 mEq/L (ref 135–145)

## 2015-04-30 LAB — HEPATIC FUNCTION PANEL
ALBUMIN: 4.6 g/dL (ref 3.5–5.2)
ALT: 22 U/L (ref 0–53)
AST: 24 U/L (ref 0–37)
Alkaline Phosphatase: 54 U/L (ref 39–117)
Bilirubin, Direct: 0.2 mg/dL (ref 0.0–0.3)
TOTAL PROTEIN: 7.1 g/dL (ref 6.0–8.3)
Total Bilirubin: 1.1 mg/dL (ref 0.2–1.2)

## 2015-04-30 LAB — LIPID PANEL
Cholesterol: 184 mg/dL (ref 0–200)
HDL: 75.7 mg/dL (ref >39.00)
LDL Cholesterol: 93 mg/dL (ref 0–99)
NonHDL: 108.36
TRIGLYCERIDES: 79 mg/dL (ref 0.0–149.0)
Total CHOL/HDL Ratio: 2
VLDL: 15.8 mg/dL (ref 0.0–40.0)

## 2015-04-30 LAB — PSA: PSA: 0.72 ng/mL (ref 0.10–4.00)

## 2015-04-30 LAB — TSH: TSH: 1.11 u[IU]/mL (ref 0.35–4.50)

## 2015-04-30 NOTE — Assessment & Plan Note (Signed)
Stable con't lisinopril  

## 2015-04-30 NOTE — Assessment & Plan Note (Signed)
con't lipitor Check labs 

## 2015-04-30 NOTE — Assessment & Plan Note (Signed)
ghm utd Check labs See AVS 

## 2015-04-30 NOTE — Progress Notes (Signed)
Patient ID: Thomas Levy, male    DOB: 1955/12/06  Age: 59 y.o. MRN: [id number]    Subjective:  Subjective HPI Thomas Levy presents for cpe and labs.  Pt also here f/u bp and cholesterol No complaints.  Review of Systems  Constitutional: Negative.   HENT: Negative for congestion, ear pain, hearing loss, nosebleeds, postnasal drip, rhinorrhea, sinus pressure, sneezing and tinnitus.   Eyes: Negative for photophobia, discharge, itching and visual disturbance.  Respiratory: Negative.   Cardiovascular: Negative.   Gastrointestinal: Negative for abdominal pain, constipation, blood in stool, abdominal distention and anal bleeding.  Endocrine: Negative.   Genitourinary: Negative.   Musculoskeletal: Negative.   Skin: Negative.   Allergic/Immunologic: Negative.   Neurological: Negative for dizziness, weakness, light-headedness, numbness and headaches.  Psychiatric/Behavioral: Negative for suicidal ideas, confusion, sleep disturbance, dysphoric mood, decreased concentration and agitation. The patient is not nervous/anxious.     History Past Medical History  Diagnosis Date  . Hypertension   . Hyperlipidemia     He has past surgical history that includes Hernia repair and Vasectomy.   His family history includes Cancer in his mother; Hyperlipidemia in his brother; Hypertension in his brother and mother; Kidney disease in his father.He reports that he has never smoked. He does not have any smokeless tobacco history on file. He reports that he drinks about 4.2 oz of alcohol per week. He reports that he does not use illicit drugs.  Current Outpatient Prescriptions on File Prior to Visit  Medication Sig Dispense Refill  . albuterol (PROAIR HFA) 108 (90 BASE) MCG/ACT inhaler Inhale 2 puffs into the lungs every 6 (six) hours as needed for wheezing or shortness of breath. 1 Inhaler 2  . allopurinol (ZYLOPRIM) 300 MG tablet TAKE 1 TABLET (300 MG TOTAL) BY MOUTH DAILY. 90 tablet 0  .  atorvastatin (LIPITOR) 20 MG tablet 1 po qhs 90 tablet 0  . clotrimazole-betamethasone (LOTRISONE) lotion Apply topically 2 (two) times daily. x's 14 days 30 mL 0  . lisinopril-hydrochlorothiazide (PRINZIDE,ZESTORETIC) 20-12.5 MG per tablet 2 TABLETS BY MOUTH DAILY- 180 tablet 1  . naftifine (NAFTIN) 1 % cream Apply topically daily. 30 g 0  . nystatin cream (MYCOSTATIN) Apply 1 application topically 2 (two) times daily. 30 g 0  . sildenafil (VIAGRA) 100 MG tablet Take 0.5-1 tablets (50-100 mg total) by mouth daily as needed for erectile dysfunction. 50 tablet 0   No current facility-administered medications on file prior to visit.     Objective:  Objective Physical Exam  Constitutional: He is oriented to person, place, and time. He appears well-developed and well-nourished. No distress.  HENT:  Head: Normocephalic and atraumatic.  Right Ear: External ear normal.  Left Ear: External ear normal.  Nose: Nose normal.  Mouth/Throat: Oropharynx is clear and moist. No oropharyngeal exudate.  Eyes: Conjunctivae and EOM are normal. Pupils are equal, round, and reactive to light. Right eye exhibits no discharge. Left eye exhibits no discharge.  Neck: Normal range of motion. Neck supple. No JVD present. No thyromegaly present.  Cardiovascular: Normal rate, regular rhythm and intact distal pulses.  Exam reveals no gallop and no friction rub.   No murmur heard. Pulmonary/Chest: Effort normal and breath sounds normal. No respiratory distress. He has no wheezes. He has no rales. He exhibits no tenderness.  Abdominal: Soft. Bowel sounds are normal. He exhibits no distension and no mass. There is no tenderness. There is no rebound and no guarding.  Genitourinary: Rectum normal, prostate normal and penis normal.  Guaiac negative stool.  Musculoskeletal: Normal range of motion. He exhibits no edema or tenderness.  Lymphadenopathy:    He has no cervical adenopathy.  Neurological: He is alert and oriented to  person, place, and time. He displays normal reflexes. He exhibits normal muscle tone.  Skin: Skin is warm and dry. No rash noted. He is not diaphoretic. No erythema. No pallor.  Psychiatric: He has a normal mood and affect. His behavior is normal. Judgment and thought content normal.   BP 108/64 mmHg  Pulse 62  Temp(Src) 98.1 F (36.7 C) (Oral)  Ht 5\' 7"  (1.702 m)  Wt 148 lb (67.132 kg)  BMI 23.17 kg/m2  SpO2 97% Wt Readings from Last 3 Encounters:  04/29/15 148 lb (67.132 kg)  02/06/14 144 lb 12.8 oz (65.681 kg)  01/31/13 146 lb (66.225 kg)     Lab Results  Component Value Date   WBC 6.4 02/06/2014   HGB 15.8 02/06/2014   HCT 46.6 02/06/2014   PLT 199.0 02/06/2014   GLUCOSE 89 02/06/2014   CHOL 185 02/06/2014   TRIG 64.0 02/06/2014   HDL 81.70 02/06/2014   LDLCALC 91 02/06/2014   ALT 22 02/06/2014   AST 29 02/06/2014   NA 139 02/06/2014   K 4.2 02/06/2014   CL 98 02/06/2014   CREATININE 1.0 02/06/2014   BUN 22 02/06/2014   CO2 32 02/06/2014   TSH 1.16 02/06/2014   PSA 0.80 02/06/2014    No results found.   Assessment & Plan:  Plan I have discontinued [name]'s fluconazole. I am also having him maintain his nystatin cream, naftifine, clotrimazole-betamethasone, lisinopril-hydrochlorothiazide, albuterol, sildenafil, atorvastatin, and allopurinol.  No orders of the defined types were placed in this encounter.    Problem List Items Addressed This Visit    Preventative health care - Primary    ghm utd Check labs See AVS      Relevant Orders   Basic metabolic panel   CBC with Differential/Platelet   Hepatic function panel   Lipid panel   POCT urinalysis dipstick   PSA   TSH   Hyperlipidemia    con't lipitor Check labs      Relevant Orders   Hepatic function panel   Lipid panel   POCT urinalysis dipstick   HTN (hypertension)    Stable con't lisinopril      Relevant Orders   Basic metabolic panel   CBC with Differential/Platelet   POCT  urinalysis dipstick    Other Visit Diagnoses    Need for shingles vaccine        Relevant Orders    Varicella-zoster vaccine subcutaneous (Completed)       Follow-up: Return in about 6 months (around 10/27/2015), or if symptoms worsen or fail to improve, for hypertension, hyperlipidemia.  Garnet Koyanagi, DO

## 2015-07-27 ENCOUNTER — Other Ambulatory Visit: Payer: Self-pay | Admitting: *Deleted

## 2015-07-27 DIAGNOSIS — E785 Hyperlipidemia, unspecified: Secondary | ICD-10-CM

## 2015-07-27 DIAGNOSIS — M109 Gout, unspecified: Secondary | ICD-10-CM

## 2015-07-27 MED ORDER — LISINOPRIL-HYDROCHLOROTHIAZIDE 20-12.5 MG PO TABS: ORAL_TABLET | ORAL | Status: DC

## 2015-07-27 MED ORDER — ATORVASTATIN CALCIUM 20 MG PO TABS: ORAL_TABLET | ORAL | Status: DC

## 2015-07-27 MED ORDER — ALLOPURINOL 300 MG PO TABS: ORAL_TABLET | ORAL | Status: DC

## 2015-10-28 ENCOUNTER — Encounter: Payer: Self-pay | Admitting: Family Medicine

## 2015-10-28 ENCOUNTER — Ambulatory Visit (INDEPENDENT_AMBULATORY_CARE_PROVIDER_SITE_OTHER): Payer: BLUE CROSS/BLUE SHIELD | Admitting: Family Medicine

## 2015-10-28 VITALS — BP 110/72 | HR 58 | Temp 98.1°F | Ht 67.0 in | Wt 140.0 lb

## 2015-10-28 DIAGNOSIS — E785 Hyperlipidemia, unspecified: Secondary | ICD-10-CM | POA: Diagnosis not present

## 2015-10-28 DIAGNOSIS — M109 Gout, unspecified: Secondary | ICD-10-CM | POA: Diagnosis not present

## 2015-10-28 DIAGNOSIS — I10 Essential (primary) hypertension: Secondary | ICD-10-CM

## 2015-10-28 NOTE — Progress Notes (Signed)
Pre visit review using our clinic review tool, if applicable. No additional management support is needed unless otherwise documented below in the visit note. 

## 2015-10-28 NOTE — Patient Instructions (Signed)
Hypertension Hypertension, commonly called high blood pressure, is when the force of blood pumping through your arteries is too strong. Your arteries are the blood vessels that carry blood from your heart throughout your body. A blood pressure reading consists of a higher number over a lower number, such as 110/72. The higher number (systolic) is the pressure inside your arteries when your heart pumps. The lower number (diastolic) is the pressure inside your arteries when your heart relaxes. Ideally you want your blood pressure below 120/80. Hypertension forces your heart to work harder to pump blood. Your arteries may become narrow or stiff. Having untreated or uncontrolled hypertension can cause heart attack, stroke, kidney disease, and other problems. RISK FACTORS Some risk factors for high blood pressure are controllable. Others are not.  Risk factors you cannot control include:   Race. You may be at higher risk if you are African American.  Age. Risk increases with age.  Gender. Men are at higher risk than women before age 45 years. After age 65, women are at higher risk than men. Risk factors you can control include:  Not getting enough exercise or physical activity.  Being overweight.  Getting too much fat, sugar, calories, or salt in your diet.  Drinking too much alcohol. SIGNS AND SYMPTOMS Hypertension does not usually cause signs or symptoms. Extremely high blood pressure (hypertensive crisis) may cause headache, anxiety, shortness of breath, and nosebleed. DIAGNOSIS To check if you have hypertension, your health care provider will measure your blood pressure while you are seated, with your arm held at the level of your heart. It should be measured at least twice using the same arm. Certain conditions can cause a difference in blood pressure between your right and left arms. A blood pressure reading that is higher than normal on one occasion does not mean that you need treatment. If  it is not clear whether you have high blood pressure, you may be asked to return on a different day to have your blood pressure checked again. Or, you may be asked to monitor your blood pressure at home for 1 or more weeks. TREATMENT Treating high blood pressure includes making lifestyle changes and possibly taking medicine. Living a healthy lifestyle can help lower high blood pressure. You may need to change some of your habits. Lifestyle changes may include:  Following the DASH diet. This diet is high in fruits, vegetables, and whole grains. It is low in salt, red meat, and added sugars.  Keep your sodium intake below 2,300 mg per day.  Getting at least 30-45 minutes of aerobic exercise at least 4 times per week.  Losing weight if necessary.  Not smoking.  Limiting alcoholic beverages.  Learning ways to reduce stress. Your health care provider may prescribe medicine if lifestyle changes are not enough to get your blood pressure under control, and if one of the following is true:  You are 18-59 years of age and your systolic blood pressure is above 140.  You are 60 years of age or older, and your systolic blood pressure is above 150.  Your diastolic blood pressure is above 90.  You have diabetes, and your systolic blood pressure is over 140 or your diastolic blood pressure is over 90.  You have kidney disease and your blood pressure is above 140/90.  You have heart disease and your blood pressure is above 140/90. Your personal target blood pressure may vary depending on your medical conditions, your age, and other factors. HOME CARE INSTRUCTIONS    Have your blood pressure rechecked as directed by your health care provider.   Take medicines only as directed by your health care provider. Follow the directions carefully. Blood pressure medicines must be taken as prescribed. The medicine does not work as well when you skip doses. Skipping doses also puts you at risk for  problems.  Do not smoke.   Monitor your blood pressure at home as directed by your health care provider. SEEK MEDICAL CARE IF:   You think you are having a reaction to medicines taken.  You have recurrent headaches or feel dizzy.  You have swelling in your ankles.  You have trouble with your vision. SEEK IMMEDIATE MEDICAL CARE IF:  You develop a severe headache or confusion.  You have unusual weakness, numbness, or feel faint.  You have severe chest or abdominal pain.  You vomit repeatedly.  You have trouble breathing. MAKE SURE YOU:   Understand these instructions.  Will watch your condition.  Will get help right away if you are not doing well or get worse.   This information is not intended to replace advice given to you by your health care provider. Make sure you discuss any questions you have with your health care provider.   Document Released: 08/14/2005 Document Revised: 12/29/2014 Document Reviewed: 06/06/2013 Elsevier Interactive Patient Education 2016 [employer]  

## 2015-10-28 NOTE — Progress Notes (Signed)
Patient ID: Thomas Levy, male    DOB: [birth year]  Age: 60 y.o. MRN: [id number]    Subjective:  Subjective HPI Thomas Levy presents for htn and cholesterol  Review of Systems  Constitutional: Negative for diaphoresis, appetite change, fatigue and unexpected weight change.  Eyes: Negative for pain, redness and visual disturbance.  Respiratory: Negative for cough, chest tightness, shortness of breath and wheezing.   Cardiovascular: Negative for chest pain, palpitations and leg swelling.  Endocrine: Negative for cold intolerance, heat intolerance, polydipsia, polyphagia and polyuria.  Genitourinary: Negative for dysuria, frequency and difficulty urinating.  Neurological: Negative for dizziness, light-headedness, numbness and headaches.    History Past Medical History  Diagnosis Date  . Hypertension   . Hyperlipidemia     He has past surgical history that includes Hernia repair and Vasectomy.   His family history includes Cancer in his mother; Hyperlipidemia in his brother; Hypertension in his brother and mother; Kidney disease in his father.He reports that he has never smoked. He does not have any smokeless tobacco history on file. He reports that he drinks about 4.2 oz of alcohol per week. He reports that he does not use illicit drugs.  Current Outpatient Prescriptions on File Prior to Visit  Medication Sig Dispense Refill  . albuterol (PROAIR HFA) 108 (90 BASE) MCG/ACT inhaler Inhale 2 puffs into the lungs every 6 (six) hours as needed for wheezing or shortness of breath. 1 Inhaler 2  . allopurinol (ZYLOPRIM) 300 MG tablet TAKE 1 TABLET (300 MG TOTAL) BY MOUTH DAILY. 90 tablet 2  . atorvastatin (LIPITOR) 20 MG tablet 1 po qhs 90 tablet 2  . clotrimazole-betamethasone (LOTRISONE) lotion Apply topically 2 (two) times daily. x's 14 days 30 mL 0  . lisinopril-hydrochlorothiazide (PRINZIDE,ZESTORETIC) 20-12.5 MG tablet 2 TABLETS BY MOUTH DAILY- 180 tablet 1  . naftifine (NAFTIN) 1 %  cream Apply topically daily. 30 g 0  . nystatin cream (MYCOSTATIN) Apply 1 application topically 2 (two) times daily. 30 g 0  . sildenafil (VIAGRA) 100 MG tablet Take 0.5-1 tablets (50-100 mg total) by mouth daily as needed for erectile dysfunction. 50 tablet 0   No current facility-administered medications on file prior to visit.     Objective:  Objective Physical Exam  Constitutional: He is oriented to person, place, and time. Vital signs are normal. He appears well-developed and well-nourished. He is sleeping.  HENT:  Head: Normocephalic and atraumatic.  Mouth/Throat: Oropharynx is clear and moist.  Eyes: EOM are normal. Pupils are equal, round, and reactive to light.  Neck: Normal range of motion. Neck supple. No thyromegaly present.  Cardiovascular: Normal rate and regular rhythm.   No murmur heard. Pulmonary/Chest: Effort normal and breath sounds normal. No respiratory distress. He has no wheezes. He has no rales. He exhibits no tenderness.  Musculoskeletal: He exhibits no edema or tenderness.  Neurological: He is alert and oriented to person, place, and time.  Skin: Skin is warm and dry.  Psychiatric: He has a normal mood and affect. His behavior is normal. Judgment and thought content normal.  Nursing note and vitals reviewed.  BP 110/72 mmHg  Pulse 58  Temp(Src) 98.1 F (36.7 C) (Oral)  Ht '5\' 7"'  (1.702 m)  Wt 140 lb (63.504 kg)  BMI 21.92 kg/m2  SpO2 98% Wt Readings from Last 3 Encounters:  10/28/15 140 lb (63.504 kg)  04/29/15 148 lb (67.132 kg)  02/06/14 144 lb 12.8 oz (65.681 kg)     Lab Results  Component Value  Date   WBC 5.5 04/29/2015   HGB 14.9 04/29/2015   HCT 44.2 04/29/2015   PLT 197.0 04/29/2015   GLUCOSE 81 10/28/2015   CHOL 174 10/28/2015   TRIG 60.0 10/28/2015   HDL 85.50 10/28/2015   LDLCALC 77 10/28/2015   ALT 21 10/28/2015   AST 48* 10/28/2015   NA 139 10/28/2015   K 3.9 10/28/2015   CL 100 10/28/2015   CREATININE 0.97 10/28/2015    BUN 23 10/28/2015   CO2 32 10/28/2015   TSH 1.11 04/29/2015   PSA 0.72 04/29/2015    No results found.   Assessment & Plan:  Plan I am having Thomas Levy maintain his nystatin cream, naftifine, clotrimazole-betamethasone, albuterol, sildenafil, lisinopril-hydrochlorothiazide, atorvastatin, and allopurinol.  No orders of the defined types were placed in this encounter.    Problem List Items Addressed This Visit    Hyperlipidemia   Relevant Orders   Comp Met (CMET) (Completed)   Lipid panel (Completed)   Uric acid (Completed)   Gout   Relevant Orders   Comp Met (CMET) (Completed)   Lipid panel (Completed)   Uric acid (Completed)    Other Visit Diagnoses    Essential hypertension    -  Primary    Relevant Orders    Comp Met (CMET) (Completed)    Lipid panel (Completed)    Uric acid (Completed)       Follow-up: Return in about 6 months (around 04/29/2016) for annual exam, fasting.  Thomas Koyanagi, DO

## 2015-10-29 LAB — COMPREHENSIVE METABOLIC PANEL
ALK PHOS: 49 U/L (ref 39–117)
ALT: 21 U/L (ref 0–53)
AST: 48 U/L — AB (ref 0–37)
Albumin: 4.6 g/dL (ref 3.5–5.2)
BILIRUBIN TOTAL: 1 mg/dL (ref 0.2–1.2)
BUN: 23 mg/dL (ref 6–23)
CALCIUM: 9.7 mg/dL (ref 8.4–10.5)
CO2: 32 meq/L (ref 19–32)
CREATININE: 0.97 mg/dL (ref 0.40–1.50)
Chloride: 100 mEq/L (ref 96–112)
GFR: 83.73 mL/min (ref >60.00)
GLUCOSE: 81 mg/dL (ref 70–99)
Potassium: 3.9 mEq/L (ref 3.5–5.1)
Sodium: 139 mEq/L (ref 135–145)
TOTAL PROTEIN: 7 g/dL (ref 6.0–8.3)

## 2015-10-29 LAB — LIPID PANEL
CHOL/HDL RATIO: 2
Cholesterol: 174 mg/dL (ref 0–200)
HDL: 85.5 mg/dL (ref >39.00)
LDL Cholesterol: 77 mg/dL (ref 0–99)
NONHDL: 88.81
TRIGLYCERIDES: 60 mg/dL (ref 0.0–149.0)
VLDL: 12 mg/dL (ref 0.0–40.0)

## 2015-10-29 LAB — URIC ACID: Uric Acid, Serum: 4 mg/dL (ref 4.0–7.8)

## 2015-10-31 NOTE — Assessment & Plan Note (Signed)
con't lipitor Check labs 

## 2015-10-31 NOTE — Assessment & Plan Note (Signed)
Stable con't meds 

## 2015-12-21 ENCOUNTER — Encounter: Payer: Self-pay | Admitting: Family Medicine

## 2015-12-21 NOTE — Telephone Encounter (Signed)
Patient [facility] to 05/23/2016 at 9:30am for physical

## 2016-01-18 ENCOUNTER — Other Ambulatory Visit: Payer: Self-pay

## 2016-01-18 MED ORDER — LISINOPRIL-HYDROCHLOROTHIAZIDE 20-12.5 MG PO TABS: 2.0000 | ORAL_TABLET | Freq: Every day | ORAL | Status: DC

## 2016-02-14 ENCOUNTER — Encounter: Payer: Self-pay | Admitting: Family Medicine

## 2016-04-01 ENCOUNTER — Other Ambulatory Visit: Payer: Self-pay | Admitting: Family Medicine

## 2016-04-01 DIAGNOSIS — J9801 Acute bronchospasm: Secondary | ICD-10-CM

## 2016-04-06 ENCOUNTER — Other Ambulatory Visit: Payer: Self-pay | Admitting: Family Medicine

## 2016-04-06 DIAGNOSIS — N529 Male erectile dysfunction, unspecified: Secondary | ICD-10-CM

## 2016-04-06 MED ORDER — SILDENAFIL CITRATE 100 MG PO TABS: 50.0000 mg | ORAL_TABLET | Freq: Every day | ORAL | 0 refills | Status: DC | PRN

## 2016-04-06 NOTE — Telephone Encounter (Signed)
Please advise      KP 

## 2016-04-20 ENCOUNTER — Other Ambulatory Visit: Payer: Self-pay | Admitting: Family Medicine

## 2016-04-20 DIAGNOSIS — E785 Hyperlipidemia, unspecified: Secondary | ICD-10-CM

## 2016-04-20 DIAGNOSIS — M109 Gout, unspecified: Secondary | ICD-10-CM

## 2016-05-04 ENCOUNTER — Encounter: Payer: BLUE CROSS/BLUE SHIELD | Admitting: Family Medicine

## 2016-05-18 ENCOUNTER — Ambulatory Visit (INDEPENDENT_AMBULATORY_CARE_PROVIDER_SITE_OTHER): Payer: BLUE CROSS/BLUE SHIELD | Admitting: Family Medicine

## 2016-05-18 ENCOUNTER — Encounter: Payer: Self-pay | Admitting: Family Medicine

## 2016-05-18 VITALS — BP 118/82 | HR 95 | Temp 98.0°F | Resp 16 | Ht 67.0 in | Wt 143.0 lb

## 2016-05-18 DIAGNOSIS — Z1159 Encounter for screening for other viral diseases: Secondary | ICD-10-CM

## 2016-05-18 DIAGNOSIS — B356 Tinea cruris: Secondary | ICD-10-CM | POA: Diagnosis not present

## 2016-05-18 DIAGNOSIS — E785 Hyperlipidemia, unspecified: Secondary | ICD-10-CM | POA: Diagnosis not present

## 2016-05-18 DIAGNOSIS — Z0000 Encounter for general adult medical examination without abnormal findings: Secondary | ICD-10-CM

## 2016-05-18 DIAGNOSIS — I10 Essential (primary) hypertension: Secondary | ICD-10-CM | POA: Diagnosis not present

## 2016-05-18 DIAGNOSIS — K649 Unspecified hemorrhoids: Secondary | ICD-10-CM

## 2016-05-18 LAB — POCT URINALYSIS DIPSTICK
Bilirubin, UA: NEGATIVE
Blood, UA: NEGATIVE
GLUCOSE UA: NEGATIVE
Ketones, UA: NEGATIVE
Leukocytes, UA: NEGATIVE
NITRITE UA: NEGATIVE
Protein, UA: NEGATIVE
Spec Grav, UA: 1.015
UROBILINOGEN UA: 0.2
pH, UA: 6.5

## 2016-05-18 MED ORDER — NYSTATIN 100000 UNIT/GM EX POWD: Freq: Four times a day (QID) | CUTANEOUS | 0 refills | Status: DC

## 2016-05-18 MED ORDER — HYDROCORTISONE ACE-PRAMOXINE 1-1 % RE FOAM: 1.0000 | Freq: Two times a day (BID) | RECTAL | 0 refills | Status: DC

## 2016-05-18 NOTE — Progress Notes (Signed)
Patient ID: Thomas Levy, male    DOB: 26-Aug-1956  Age: 60 y.o. MRN: [id number]    Subjective:  Subjective  HPI Thomas Levy presents for cpe and labs.  No complaints  Review of Systems  Constitutional: Negative for chills and fever.  HENT: Negative for congestion and hearing loss.   Eyes: Negative for discharge.  Respiratory: Negative for cough and shortness of breath.   Cardiovascular: Negative for chest pain, palpitations and leg swelling.  Gastrointestinal: Negative for abdominal pain, blood in stool, constipation, diarrhea, nausea and vomiting.  Genitourinary: Negative for dysuria, frequency, hematuria and urgency.  Musculoskeletal: Negative for back pain and myalgias.  Skin: Negative for rash.  Allergic/Immunologic: Negative for environmental allergies.  Neurological: Negative for dizziness, weakness and headaches.  Hematological: Does not bruise/bleed easily.  Psychiatric/Behavioral: Negative for suicidal ideas. The patient is not nervous/anxious.     History Past Medical History:  Diagnosis Date  . Hyperlipidemia   . Hypertension     He has a past surgical history that includes Hernia repair and Vasectomy.   His family history includes Cancer in his mother; Hyperlipidemia in his brother; Hypertension in his brother and mother; Kidney disease in his father.He reports that he has never smoked. He does not have any smokeless tobacco history on file. He reports that he drinks about 4.2 oz of alcohol per week . He reports that he does not use drugs.  Current Outpatient Prescriptions on File Prior to Visit  Medication Sig Dispense Refill  . allopurinol (ZYLOPRIM) 300 MG tablet TAKE 1 TABLET BY MOUTH DAILY 90 tablet 2  . clotrimazole-betamethasone (LOTRISONE) lotion Apply topically 2 (two) times daily. x's 14 days 30 mL 0  . naftifine (NAFTIN) 1 % cream Apply topically daily. 30 g 0  . nystatin cream (MYCOSTATIN) Apply 1 application topically 2 (two) times daily. 30  g 0  . PROAIR HFA 108 (90 Base) MCG/ACT inhaler INHALE 2 PUFFS INTO THE LUNGS EVERY 6 (SIX) HOURS AS NEEDED FOR WHEEZING OR SHORTNESS OF BREATH. 8.5 Inhaler 0  . sildenafil (VIAGRA) 100 MG tablet Take 0.5-1 tablets (50-100 mg total) by mouth daily as needed for erectile dysfunction. 100 tablet 0   No current facility-administered medications on file prior to visit.      Objective:  Objective  Physical Exam  Constitutional: He is oriented to person, place, and time. He appears well-developed and well-nourished. No distress.  HENT:  Head: Normocephalic and atraumatic.  Right Ear: External ear normal.  Left Ear: External ear normal.  Nose: Nose normal.  Mouth/Throat: Oropharynx is clear and moist. No oropharyngeal exudate.  Eyes: Conjunctivae and EOM are normal. Pupils are equal, round, and reactive to light. Right eye exhibits no discharge. Left eye exhibits no discharge.  Neck: Normal range of motion. Neck supple. No JVD present. No thyromegaly present.  Cardiovascular: Normal rate, regular rhythm and intact distal pulses.  Exam reveals no gallop and no friction rub.   No murmur heard. Pulmonary/Chest: Effort normal and breath sounds normal. No respiratory distress. He has no wheezes. He has no rales. He exhibits no tenderness.  Abdominal: Soft. Bowel sounds are normal. He exhibits no distension and no mass. There is no tenderness. There is no rebound and no guarding.  Genitourinary: Prostate normal and penis normal. Rectal exam shows external hemorrhoid and guaiac positive stool.  Musculoskeletal: Normal range of motion. He exhibits no edema or tenderness.  Lymphadenopathy:    He has no cervical adenopathy.  Neurological: He  is alert and oriented to person, place, and time. He displays normal reflexes. He exhibits normal muscle tone.  Skin: Skin is warm and dry. No rash noted. He is not diaphoretic. No erythema. No pallor.  Psychiatric: He has a normal mood and affect. His behavior is  normal. Judgment and thought content normal.  Nursing note and vitals reviewed.  BP 118/82 (BP Location: Right Arm, Patient Position: Sitting, Cuff Size: Normal)   Pulse 95   Temp 98 F (36.7 C) (Oral)   Resp 16   Ht 5\' 7"  (1.702 m)   Wt 143 lb (64.9 kg)   SpO2 96%   BMI 22.40 kg/m  Wt Readings from Last 3 Encounters:  05/18/16 143 lb (64.9 kg)  10/28/15 140 lb (63.5 kg)  04/29/15 148 lb (67.1 kg)     Lab Results  Component Value Date   WBC 6.0 05/18/2016   HGB 15.3 05/18/2016   HCT 43.8 05/18/2016   PLT 206.0 05/18/2016   GLUCOSE 80 05/18/2016   CHOL 178 05/18/2016   TRIG 95.0 05/18/2016   HDL 73.70 05/18/2016   LDLCALC 85 05/18/2016   ALT 18 05/18/2016   AST 22 05/18/2016   NA 140 05/18/2016   K 3.9 05/18/2016   CL 99 05/18/2016   CREATININE 1.00 05/18/2016   BUN 17 05/18/2016   CO2 31 05/18/2016   TSH 1.31 05/18/2016   PSA 0.68 05/18/2016    No results found.   Assessment & Plan:  Plan  I have changed Mr. Thomas Levy's atorvastatin. I am also having him start on nystatin and hydrocortisone-pramoxine. Additionally, I am having him maintain his nystatin cream, naftifine, clotrimazole-betamethasone, PROAIR HFA, sildenafil, allopurinol, and lisinopril-hydrochlorothiazide.  Meds ordered this encounter  Medications  . nystatin (MYCOSTATIN/NYSTOP) powder    Sig: Apply topically 4 (four) times daily.    Dispense:  15 g    Refill:  0  . hydrocortisone-pramoxine (PROCTOFOAM HC) rectal foam    Sig: Place 1 applicator rectally 2 (two) times daily.    Dispense:  10 g    Refill:  0  . lisinopril-hydrochlorothiazide (PRINZIDE,ZESTORETIC) 20-12.5 MG tablet    Sig: Take 2 tablets by mouth daily.    Dispense:  180 tablet    Refill:  1  . atorvastatin (LIPITOR) 20 MG tablet    Sig: Take 1 tablet (20 mg total) by mouth at bedtime.    Dispense:  90 tablet    Refill:  0    Problem List Items Addressed This Visit      Unprioritized   Preventative health care - Primary    Relevant Orders   Comprehensive metabolic panel (Completed)   Lipid panel (Completed)   CBC with Differential/Platelet (Completed)   POCT urinalysis dipstick (Completed)   TSH (Completed)   PSA (Completed)   HTN (hypertension)    Stable con't meds      Relevant Medications   lisinopril-hydrochlorothiazide (PRINZIDE,ZESTORETIC) 20-12.5 MG tablet   atorvastatin (LIPITOR) 20 MG tablet   Other Relevant Orders   Comprehensive metabolic panel (Completed)   CBC with Differential/Platelet (Completed)   POCT urinalysis dipstick (Completed)   TSH (Completed)   Hyperlipidemia    con't meds Check labs      Relevant Medications   lisinopril-hydrochlorothiazide (PRINZIDE,ZESTORETIC) 20-12.5 MG tablet   atorvastatin (LIPITOR) 20 MG tablet   Other Relevant Orders   Comprehensive metabolic panel (Completed)   Lipid panel (Completed)   POCT urinalysis dipstick (Completed)   TSH (Completed)    Other Visit Diagnoses  Tinea cruris       Relevant Medications   nystatin (MYCOSTATIN/NYSTOP) powder   Need for hepatitis C screening test       Relevant Orders   Hepatitis C antibody (Completed)   Hemorrhoids, unspecified hemorrhoid type       Relevant Medications   hydrocortisone-pramoxine (PROCTOFOAM HC) rectal foam   lisinopril-hydrochlorothiazide (PRINZIDE,ZESTORETIC) 20-12.5 MG tablet   atorvastatin (LIPITOR) 20 MG tablet      Follow-up: Return in about 1 year (around 05/18/2017) for hypertension, annual exam, fasting.  Ann Held, DO

## 2016-05-18 NOTE — Progress Notes (Signed)
Pre visit review using our clinic review tool, if applicable. No additional management support is needed unless otherwise documented below in the visit note. 

## 2016-05-18 NOTE — Patient Instructions (Signed)

## 2016-05-19 LAB — TSH: TSH: 1.31 u[IU]/mL (ref 0.35–4.50)

## 2016-05-19 LAB — CBC WITH DIFFERENTIAL/PLATELET
BASOS ABS: 0.1 10*3/uL (ref 0.0–0.1)
BASOS PCT: 0.9 % (ref 0.0–3.0)
EOS ABS: 0.1 10*3/uL (ref 0.0–0.7)
Eosinophils Relative: 2.2 % (ref 0.0–5.0)
HEMATOCRIT: 43.8 % (ref 39.0–52.0)
HEMOGLOBIN: 15.3 g/dL (ref 13.0–17.0)
LYMPHS PCT: 30.3 % (ref 12.0–46.0)
Lymphs Abs: 1.8 10*3/uL (ref 0.7–4.0)
MCHC: 34.9 g/dL (ref 30.0–36.0)
MCV: 95.3 fl (ref 78.0–100.0)
MONOS PCT: 6.7 % (ref 3.0–12.0)
Monocytes Absolute: 0.4 10*3/uL (ref 0.1–1.0)
Neutro Abs: 3.6 10*3/uL (ref 1.4–7.7)
Neutrophils Relative %: 59.9 % (ref 43.0–77.0)
Platelets: 206 10*3/uL (ref 150.0–400.0)
RBC: 4.59 Mil/uL (ref 4.22–5.81)
RDW: 13.5 % (ref 11.5–15.5)
WBC: 6 10*3/uL (ref 4.0–10.5)

## 2016-05-19 LAB — COMPREHENSIVE METABOLIC PANEL
ALBUMIN: 4.3 g/dL (ref 3.5–5.2)
ALT: 18 U/L (ref 0–53)
AST: 22 U/L (ref 0–37)
Alkaline Phosphatase: 49 U/L (ref 39–117)
BILIRUBIN TOTAL: 0.7 mg/dL (ref 0.2–1.2)
BUN: 17 mg/dL (ref 6–23)
CALCIUM: 9.5 mg/dL (ref 8.4–10.5)
CHLORIDE: 99 meq/L (ref 96–112)
CO2: 31 mEq/L (ref 19–32)
CREATININE: 1 mg/dL (ref 0.40–1.50)
GFR: 80.69 mL/min (ref >60.00)
Glucose, Bld: 80 mg/dL (ref 70–99)
Potassium: 3.9 mEq/L (ref 3.5–5.1)
SODIUM: 140 meq/L (ref 135–145)
TOTAL PROTEIN: 7 g/dL (ref 6.0–8.3)

## 2016-05-19 LAB — LIPID PANEL
CHOLESTEROL: 178 mg/dL (ref 0–200)
HDL: 73.7 mg/dL (ref >39.00)
LDL CALC: 85 mg/dL (ref 0–99)
NonHDL: 104.3
TRIGLYCERIDES: 95 mg/dL (ref 0.0–149.0)
Total CHOL/HDL Ratio: 2
VLDL: 19 mg/dL (ref 0.0–40.0)

## 2016-05-19 LAB — HEPATITIS C ANTIBODY: HCV Ab: NEGATIVE

## 2016-05-19 LAB — PSA: PSA: 0.68 ng/mL (ref 0.10–4.00)

## 2016-05-21 MED ORDER — LISINOPRIL-HYDROCHLOROTHIAZIDE 20-12.5 MG PO TABS: 2.0000 | ORAL_TABLET | Freq: Every day | ORAL | 1 refills | Status: DC

## 2016-05-21 MED ORDER — ATORVASTATIN CALCIUM 20 MG PO TABS: 20.0000 mg | ORAL_TABLET | Freq: Every day | ORAL | 0 refills | Status: DC

## 2016-05-21 NOTE — Assessment & Plan Note (Signed)
con't meds  Check labs 

## 2016-05-21 NOTE — Assessment & Plan Note (Signed)
Stable con't meds 

## 2016-05-23 ENCOUNTER — Encounter: Payer: BLUE CROSS/BLUE SHIELD | Admitting: Family Medicine

## 2016-06-04 ENCOUNTER — Other Ambulatory Visit: Payer: Self-pay | Admitting: Family Medicine

## 2016-06-04 DIAGNOSIS — M109 Gout, unspecified: Secondary | ICD-10-CM

## 2016-07-25 ENCOUNTER — Other Ambulatory Visit: Payer: Self-pay | Admitting: Family Medicine

## 2016-07-25 DIAGNOSIS — E785 Hyperlipidemia, unspecified: Secondary | ICD-10-CM

## 2016-09-03 ENCOUNTER — Other Ambulatory Visit: Payer: Self-pay | Admitting: Family Medicine

## 2016-10-20 ENCOUNTER — Other Ambulatory Visit: Payer: Self-pay | Admitting: Family Medicine

## 2016-10-20 DIAGNOSIS — E785 Hyperlipidemia, unspecified: Secondary | ICD-10-CM

## 2016-10-31 DIAGNOSIS — H43313 Vitreous membranes and strands, bilateral: Secondary | ICD-10-CM | POA: Diagnosis not present

## 2016-10-31 DIAGNOSIS — H43399 Other vitreous opacities, unspecified eye: Secondary | ICD-10-CM | POA: Diagnosis not present

## 2017-04-01 ENCOUNTER — Other Ambulatory Visit: Payer: Self-pay | Admitting: Family Medicine

## 2017-04-01 DIAGNOSIS — E785 Hyperlipidemia, unspecified: Secondary | ICD-10-CM

## 2017-04-26 ENCOUNTER — Other Ambulatory Visit: Payer: Self-pay | Admitting: Family Medicine

## 2017-04-26 NOTE — Telephone Encounter (Signed)
Faxed 90d Lisinopril HCT till OV to [facility]/thx dmf

## 2017-04-30 ENCOUNTER — Encounter: Payer: Self-pay | Admitting: Family Medicine

## 2017-05-01 ENCOUNTER — Encounter: Payer: Self-pay | Admitting: Family Medicine

## 2017-05-01 ENCOUNTER — Telehealth: Payer: Self-pay | Admitting: Family Medicine

## 2017-05-01 NOTE — Telephone Encounter (Signed)
Caller name: self Call back [phone] Pharmacy: [id number] - [place], [place]     Reason for call: patient requesting antibiotics or urine orders due to pressure while voiding and increased urge to urinate, patient declined appointment until PCP advise

## 2017-05-01 NOTE — Telephone Encounter (Signed)
Patient scheduled with PCP for 05/03/17

## 2017-05-01 NOTE — Telephone Encounter (Signed)
Ok to leave urine but really needs prostate checked based on symptoms

## 2017-05-03 ENCOUNTER — Encounter: Payer: Self-pay | Admitting: Family Medicine

## 2017-05-03 ENCOUNTER — Ambulatory Visit (INDEPENDENT_AMBULATORY_CARE_PROVIDER_SITE_OTHER): Payer: BLUE CROSS/BLUE SHIELD | Admitting: Family Medicine

## 2017-05-03 VITALS — BP 100/56 | HR 91 | Temp 98.4°F | Ht 67.0 in | Wt 144.4 lb

## 2017-05-03 DIAGNOSIS — Z23 Encounter for immunization: Secondary | ICD-10-CM | POA: Diagnosis not present

## 2017-05-03 DIAGNOSIS — R350 Frequency of micturition: Secondary | ICD-10-CM

## 2017-05-03 DIAGNOSIS — N401 Enlarged prostate with lower urinary tract symptoms: Secondary | ICD-10-CM

## 2017-05-03 LAB — POCT URINALYSIS DIPSTICK
Bilirubin, UA: NEGATIVE
Blood, UA: POSITIVE
Glucose, UA: NEGATIVE
Ketones, UA: POSITIVE
NITRITE UA: POSITIVE
PROTEIN UA: POSITIVE
SPEC GRAV UA: 1.025 (ref 1.010–1.025)
UROBILINOGEN UA: 0.2 U/dL
pH, UA: 6 (ref 5.0–8.0)

## 2017-05-03 LAB — CBC WITH DIFFERENTIAL/PLATELET
BASOS PCT: 0.3 % (ref 0.0–3.0)
Basophils Absolute: 0 10*3/uL (ref 0.0–0.1)
EOS PCT: 1.5 % (ref 0.0–5.0)
Eosinophils Absolute: 0.1 10*3/uL (ref 0.0–0.7)
HEMATOCRIT: 44.8 % (ref 39.0–52.0)
HEMOGLOBIN: 15.1 g/dL (ref 13.0–17.0)
LYMPHS PCT: 17.1 % (ref 12.0–46.0)
Lymphs Abs: 1.2 10*3/uL (ref 0.7–4.0)
MCHC: 33.6 g/dL (ref 30.0–36.0)
MCV: 99.3 fl (ref 78.0–100.0)
MONOS PCT: 12.3 % — AB (ref 3.0–12.0)
Monocytes Absolute: 0.9 10*3/uL (ref 0.1–1.0)
Neutro Abs: 5 10*3/uL (ref 1.4–7.7)
Neutrophils Relative %: 68.8 % (ref 43.0–77.0)
Platelets: 212 10*3/uL (ref 150.0–400.0)
RBC: 4.51 Mil/uL (ref 4.22–5.81)
RDW: 13.1 % (ref 11.5–15.5)
WBC: 7.3 10*3/uL (ref 4.0–10.5)

## 2017-05-03 LAB — COMPREHENSIVE METABOLIC PANEL
ALBUMIN: 4.2 g/dL (ref 3.5–5.2)
ALT: 23 U/L (ref 0–53)
AST: 28 U/L (ref 0–37)
Alkaline Phosphatase: 63 U/L (ref 39–117)
BUN: 30 mg/dL — ABNORMAL HIGH (ref 6–23)
CALCIUM: 9.7 mg/dL (ref 8.4–10.5)
CHLORIDE: 96 meq/L (ref 96–112)
CO2: 35 mEq/L — ABNORMAL HIGH (ref 19–32)
CREATININE: 1.13 mg/dL (ref 0.40–1.50)
GFR: 69.86 mL/min (ref >60.00)
Glucose, Bld: 103 mg/dL — ABNORMAL HIGH (ref 70–99)
POTASSIUM: 4.3 meq/L (ref 3.5–5.1)
Sodium: 140 mEq/L (ref 135–145)
Total Bilirubin: 0.9 mg/dL (ref 0.2–1.2)
Total Protein: 7.2 g/dL (ref 6.0–8.3)

## 2017-05-03 LAB — PSA: PSA: 20.13 ng/mL — ABNORMAL HIGH (ref 0.10–4.00)

## 2017-05-03 MED ORDER — CIPROFLOXACIN HCL 500 MG PO TABS: 500.0000 mg | ORAL_TABLET | Freq: Two times a day (BID) | ORAL | 0 refills | Status: DC

## 2017-05-03 NOTE — Patient Instructions (Signed)
Urinary Tract Infection, Adult °A urinary tract infection (UTI) is an infection of any part of the urinary tract. The urinary tract includes the: °· Kidneys. °· Ureters. °· Bladder. °· Urethra. ° °These organs make, store, and get rid of pee (urine) in the body. °Follow these instructions at home: °· Take over-the-counter and prescription medicines only as told by your doctor. °· If you were prescribed an antibiotic medicine, take it as told by your doctor. Do not stop taking the antibiotic even if you start to feel better. °· Avoid the following drinks: °? Alcohol. °? Caffeine. °? Tea. °? Carbonated drinks. °· Drink enough fluid to keep your pee clear or pale yellow. °· Keep all follow-up visits as told by your doctor. This is important. °· Make sure to: °? Empty your bladder often and completely. Do not to hold pee for long periods of time. °? Empty your bladder before and after sex. °? Wipe from front to back after a bowel movement if you are male. Use each tissue one time when you wipe. °Contact a doctor if: °· You have back pain. °· You have a fever. °· You feel sick to your stomach (nauseous). °· You throw up (vomit). °· Your symptoms do not get better after 3 days. °· Your symptoms go away and then come back. °Get help right away if: °· You have very bad back pain. °· You have very bad lower belly (abdominal) pain. °· You are throwing up and cannot keep down any medicines or water. °This information is not intended to replace advice given to you by your health care provider. Make sure you discuss any questions you have with your health care provider. °Document Released: 01/31/2008 Document Revised: 01/20/2016 Document Reviewed: 07/05/2015 °Elsevier Interactive Patient Education © 2018 Elsevier Inc. ° °

## 2017-05-03 NOTE — Progress Notes (Signed)
Subjective:    Patient ID: Thomas Levy, male    DOB: 08-16-1956, 61 y.o.   MRN: [id number]  Chief Complaint  Patient presents with  . Acute Visit  . Urinary Frequency    frequency has subsided some, urine is alittle cloudy, Denies strong odor, dysuria, blood in urine, sx started 2-3 days ago     HPI Patient is in today for frequent urination q2-3 hours .  He also has trouble starting to urinate and then it is a slow stream.     Past Medical History:  Diagnosis Date  . Hyperlipidemia   . Hypertension     Past Surgical History:  Procedure Laterality Date  . HERNIA REPAIR    . VASECTOMY      Family History  Problem Relation Age of Onset  . Hypertension Mother   . Cancer Mother        breast  . Kidney disease Father        kidney disease-- on dialysis  . Hypertension Brother   . Hyperlipidemia Brother     Social History   Social History  . Marital status: Married    Spouse name: N/A  . Number of children: N/A  . Years of education: N/A   Occupational History  . Not on file.   Social History Main Topics  . Smoking status: Never Smoker  . Smokeless tobacco: Never Used  . Alcohol use 4.2 oz/week    7 Glasses of wine per week  . Drug use: No  . Sexual activity: Yes    Partners: Female   Other Topics Concern  . Not on file   Social History Narrative   Gym-- 4 days a week for 1 hour    Outpatient Medications Prior to Visit  Medication Sig Dispense Refill  . allopurinol (ZYLOPRIM) 300 MG tablet TAKE 1 TABLET BY MOUTH DAILY 90 tablet 2  . atorvastatin (LIPITOR) 20 MG tablet TAKE 1 TABLET BY MOUTH AT BEDTIME 90 tablet 0  . clotrimazole-betamethasone (LOTRISONE) lotion Apply topically 2 (two) times daily. x's 14 days 30 mL 0  . hydrocortisone-pramoxine (PROCTOFOAM HC) rectal foam Place 1 applicator rectally 2 (two) times daily. 10 g 0  . lisinopril-hydrochlorothiazide (PRINZIDE,ZESTORETIC) 20-12.5 MG tablet Take 2 tablets by mouth daily. 180 tablet 1  .  nystatin (MYCOSTATIN/NYSTOP) powder Apply topically 4 (four) times daily. 15 g 0  . PROAIR HFA 108 (90 Base) MCG/ACT inhaler INHALE 2 PUFFS INTO THE LUNGS EVERY 6 (SIX) HOURS AS NEEDED FOR WHEEZING OR SHORTNESS OF BREATH. 8.5 Inhaler 0  . sildenafil (VIAGRA) 100 MG tablet Take 0.5-1 tablets (50-100 mg total) by mouth daily as needed for erectile dysfunction. 100 tablet 0  . allopurinol (ZYLOPRIM) 300 MG tablet TAKE 1 TABLET BY MOUTH DAILY 90 tablet 2  . atorvastatin (LIPITOR) 20 MG tablet Take 1 tablet (20 mg total) by mouth at bedtime. 90 tablet 0  . atorvastatin (LIPITOR) 20 MG tablet TAKE 1 TABLET BY MOUTH AT BEDTIME 90 tablet 0  . naftifine (NAFTIN) 1 % cream Apply topically daily. (Patient not taking: Reported on 05/03/2017) 30 g 0  . nystatin cream (MYCOSTATIN) Apply 1 application topically 2 (two) times daily. (Patient not taking: Reported on 05/03/2017) 30 g 0   No facility-administered medications prior to visit.     No Known Allergies  Review of Systems  Constitutional: Negative for fever and malaise/fatigue.  HENT: Negative for congestion.   Eyes: Negative for blurred vision.  Respiratory: Negative for cough  and shortness of breath.   Cardiovascular: Negative for chest pain, palpitations and leg swelling.  Gastrointestinal: Negative for vomiting.  Genitourinary: Positive for dysuria, frequency and urgency.  Musculoskeletal: Negative for back pain.  Skin: Negative for rash.  Neurological: Negative for loss of consciousness and headaches.       Objective:    Physical Exam  Constitutional: He is oriented to person, place, and time. Vital signs are normal. He appears well-developed and well-nourished. He is sleeping.  HENT:  Head: Normocephalic and atraumatic.  Mouth/Throat: Oropharynx is clear and moist.  Eyes: Pupils are equal, round, and reactive to light. EOM are normal.  Neck: Normal range of motion. Neck supple. No thyromegaly present.  Cardiovascular: Normal rate and  regular rhythm.   No murmur heard. Pulmonary/Chest: Effort normal and breath sounds normal. No respiratory distress. He has no wheezes. He has no rales. He exhibits no tenderness.  Abdominal: He exhibits no distension and no mass. There is no tenderness. There is no rebound and no guarding.  Genitourinary: Rectal exam shows guaiac negative stool. Prostate is enlarged. Prostate is not tender.  Musculoskeletal: He exhibits no edema or tenderness.  Neurological: He is alert and oriented to person, place, and time.  Skin: Skin is warm and dry.  Psychiatric: He has a normal mood and affect. His behavior is normal. Judgment and thought content normal.  Nursing note and vitals reviewed.   BP (!) 100/56 (BP Location: Left Arm)   Pulse 91   Temp 98.4 F (36.9 C) (Oral)   Ht 5\' 7"  (1.702 m)   Wt 144 lb 6.4 oz (65.5 kg)   SpO2 96%   BMI 22.62 kg/m  Wt Readings from Last 3 Encounters:  05/03/17 144 lb 6.4 oz (65.5 kg)  05/18/16 143 lb (64.9 kg)  10/28/15 140 lb (63.5 kg)     Lab Results  Component Value Date   WBC 6.0 05/18/2016   HGB 15.3 05/18/2016   HCT 43.8 05/18/2016   PLT 206.0 05/18/2016   GLUCOSE 80 05/18/2016   CHOL 178 05/18/2016   TRIG 95.0 05/18/2016   HDL 73.70 05/18/2016   LDLCALC 85 05/18/2016   ALT 18 05/18/2016   AST 22 05/18/2016   NA 140 05/18/2016   K 3.9 05/18/2016   CL 99 05/18/2016   CREATININE 1.00 05/18/2016   BUN 17 05/18/2016   CO2 31 05/18/2016   TSH 1.31 05/18/2016   PSA 0.68 05/18/2016    Lab Results  Component Value Date   TSH 1.31 05/18/2016   Lab Results  Component Value Date   WBC 6.0 05/18/2016   HGB 15.3 05/18/2016   HCT 43.8 05/18/2016   MCV 95.3 05/18/2016   PLT 206.0 05/18/2016   Lab Results  Component Value Date   NA 140 05/18/2016   K 3.9 05/18/2016   CO2 31 05/18/2016   GLUCOSE 80 05/18/2016   BUN 17 05/18/2016   CREATININE 1.00 05/18/2016   BILITOT 0.7 05/18/2016   ALKPHOS 49 05/18/2016   AST 22 05/18/2016   ALT  18 05/18/2016   PROT 7.0 05/18/2016   ALBUMIN 4.3 05/18/2016   CALCIUM 9.5 05/18/2016   GFR 80.69 05/18/2016   Lab Results  Component Value Date   CHOL 178 05/18/2016   Lab Results  Component Value Date   HDL 73.70 05/18/2016   Lab Results  Component Value Date   LDLCALC 85 05/18/2016   Lab Results  Component Value Date   TRIG 95.0 05/18/2016   Lab  Results  Component Value Date   CHOLHDL 2 05/18/2016   No results found for: HGBA1C     Assessment & Plan:   Problem List Items Addressed This Visit    None    Visit Diagnoses    Urinary frequency    -  Primary   Relevant Medications   ciprofloxacin (CIPRO) 500 MG tablet   Other Relevant Orders   POCT urinalysis dipstick (Completed)   PSA   CBC with Differential/Platelet   Comprehensive metabolic panel   Benign prostatic hyperplasia with urinary frequency       Relevant Medications   ciprofloxacin (CIPRO) 500 MG tablet   Other Relevant Orders   PSA   CBC with Differential/Platelet   Comprehensive metabolic panel      I have discontinued Mr. Hulen's nystatin cream and naftifine. I am also having him start on ciprofloxacin. Additionally, I am having him maintain his clotrimazole-betamethasone, PROAIR HFA, sildenafil, allopurinol, nystatin, hydrocortisone-pramoxine, lisinopril-hydrochlorothiazide, and atorvastatin.  Meds ordered this encounter  Medications  . ciprofloxacin (CIPRO) 500 MG tablet    Sig: Take 1 tablet (500 mg total) by mouth 2 (two) times daily.    Dispense:  60 tablet    Refill:  0     Ann Held, DO

## 2017-05-09 ENCOUNTER — Other Ambulatory Visit: Payer: Self-pay | Admitting: Family Medicine

## 2017-05-09 DIAGNOSIS — J9801 Acute bronchospasm: Secondary | ICD-10-CM

## 2017-05-09 MED ORDER — ALBUTEROL SULFATE HFA 108 (90 BASE) MCG/ACT IN AERS: 2.0000 | INHALATION_SPRAY | Freq: Four times a day (QID) | RESPIRATORY_TRACT | 5 refills | Status: DC | PRN

## 2017-05-24 ENCOUNTER — Encounter: Payer: Self-pay | Admitting: Family Medicine

## 2017-05-24 ENCOUNTER — Ambulatory Visit (INDEPENDENT_AMBULATORY_CARE_PROVIDER_SITE_OTHER): Payer: BLUE CROSS/BLUE SHIELD | Admitting: Family Medicine

## 2017-05-24 VITALS — BP 108/66 | HR 68 | Ht 67.0 in | Wt 141.0 lb

## 2017-05-24 DIAGNOSIS — E785 Hyperlipidemia, unspecified: Secondary | ICD-10-CM

## 2017-05-24 DIAGNOSIS — Z0000 Encounter for general adult medical examination without abnormal findings: Secondary | ICD-10-CM

## 2017-05-24 DIAGNOSIS — N410 Acute prostatitis: Secondary | ICD-10-CM | POA: Diagnosis not present

## 2017-05-24 DIAGNOSIS — M109 Gout, unspecified: Secondary | ICD-10-CM

## 2017-05-24 DIAGNOSIS — I10 Essential (primary) hypertension: Secondary | ICD-10-CM | POA: Diagnosis not present

## 2017-05-24 NOTE — Assessment & Plan Note (Signed)
Tolerating statin, encouraged heart healthy diet, avoid trans fats, minimize simple carbs and saturated fats. Increase exercise as tolerated 

## 2017-05-24 NOTE — Progress Notes (Signed)
Patient ID: Thomas Levy, male    DOB: 03-03-1956  Age: 61 y.o. MRN: [id number]    Subjective:  Subjective  HPI Thomas Levy presents for cpe and f/u uti and prostatitis.  Pt is feeling much better.    Review of Systems  Constitutional: Positive for fatigue. Negative for appetite change, diaphoresis and unexpected weight change.  Eyes: Negative for pain, redness and visual disturbance.  Respiratory: Negative for cough, chest tightness, shortness of breath and wheezing.   Cardiovascular: Negative for chest pain, palpitations and leg swelling.  Endocrine: Negative for cold intolerance, heat intolerance, polydipsia, polyphagia and polyuria.  Genitourinary: Negative for difficulty urinating, dysuria and frequency.  Neurological: Negative for dizziness, light-headedness, numbness and headaches.    History Past Medical History:  Diagnosis Date  . Hyperlipidemia   . Hypertension     He has a past surgical history that includes Hernia repair and Vasectomy.   His family history includes Cancer in his mother; Hyperlipidemia in his brother; Hypertension in his brother and mother; Kidney disease in his father.He reports that he has never smoked. He has never used smokeless tobacco. He reports that he drinks about 4.2 oz of alcohol per week . He reports that he does not use drugs.  Current Outpatient Prescriptions on File Prior to Visit  Medication Sig Dispense Refill  . albuterol (PROAIR HFA) 108 (90 Base) MCG/ACT inhaler Inhale 2 puffs into the lungs every 6 (six) hours as needed for wheezing or shortness of breath. 18 g 5  . allopurinol (ZYLOPRIM) 300 MG tablet TAKE 1 TABLET BY MOUTH DAILY 90 tablet 2  . atorvastatin (LIPITOR) 20 MG tablet TAKE 1 TABLET BY MOUTH AT BEDTIME 90 tablet 0  . ciprofloxacin (CIPRO) 500 MG tablet Take 1 tablet (500 mg total) by mouth 2 (two) times daily. 60 tablet 0  . clotrimazole-betamethasone (LOTRISONE) lotion Apply topically 2 (two) times daily. x's 14 days  30 mL 0  . hydrocortisone-pramoxine (PROCTOFOAM HC) rectal foam Place 1 applicator rectally 2 (two) times daily. 10 g 0  . lisinopril-hydrochlorothiazide (PRINZIDE,ZESTORETIC) 20-12.5 MG tablet Take 2 tablets by mouth daily. 180 tablet 1  . nystatin (MYCOSTATIN/NYSTOP) powder Apply topically 4 (four) times daily. 15 g 0  . sildenafil (VIAGRA) 100 MG tablet Take 0.5-1 tablets (50-100 mg total) by mouth daily as needed for erectile dysfunction. 100 tablet 0   No current facility-administered medications on file prior to visit.      Objective:  Objective  Physical Exam  Constitutional: He is oriented to person, place, and time. Vital signs are normal. He appears well-developed and well-nourished. He is sleeping. No distress.  HENT:  Head: Normocephalic and atraumatic.  Mouth/Throat: Oropharynx is clear and moist.  Eyes: Pupils are equal, round, and reactive to light. EOM are normal.  Neck: Normal range of motion. Neck supple. No thyromegaly present.  Cardiovascular: Normal rate, regular rhythm and normal heart sounds.   No murmur heard. Pulmonary/Chest: Effort normal and breath sounds normal. No respiratory distress. He has no wheezes. He has no rales. He exhibits no tenderness.  Genitourinary:  Genitourinary Comments: Done last visit  Musculoskeletal: He exhibits no edema or tenderness.  Neurological: He is alert and oriented to person, place, and time.  Skin: Skin is warm and dry.  Psychiatric: He has a normal mood and affect. His behavior is normal. Judgment and thought content normal.  Nursing note and vitals reviewed.  BP 108/66   Pulse 68   Ht 5\' 7"  (1.702 m)  Wt 141 lb (64 kg)   SpO2 96%   BMI 22.08 kg/m  Wt Readings from Last 3 Encounters:  05/24/17 141 lb (64 kg)  05/03/17 144 lb 6.4 oz (65.5 kg)  05/18/16 143 lb (64.9 kg)     Lab Results  Component Value Date   WBC 7.3 05/03/2017   HGB 15.1 05/03/2017   HCT 44.8 05/03/2017   PLT 212.0 05/03/2017   GLUCOSE 103  (H) 05/03/2017   CHOL 178 05/18/2016   TRIG 95.0 05/18/2016   HDL 73.70 05/18/2016   LDLCALC 85 05/18/2016   ALT 23 05/03/2017   AST 28 05/03/2017   NA 140 05/03/2017   K 4.3 05/03/2017   CL 96 05/03/2017   CREATININE 1.13 05/03/2017   BUN 30 (H) 05/03/2017   CO2 35 (H) 05/03/2017   TSH 1.31 05/18/2016   PSA 20.13 (H) 05/03/2017    No results found.   Assessment & Plan:  Plan  I am having Mr. Hirschman maintain his clotrimazole-betamethasone, sildenafil, allopurinol, nystatin, hydrocortisone-pramoxine, lisinopril-hydrochlorothiazide, atorvastatin, ciprofloxacin, and albuterol.  No orders of the defined types were placed in this encounter.   Problem List Items Addressed This Visit      Unprioritized   Gout   Relevant Orders   Uric acid   Preventative health care - Primary   Relevant Orders   CBC with Differential/Platelet   Lipid panel   PSA   TSH   Comprehensive metabolic panel   POCT Urinalysis Dipstick (Automated)   Acute prostatitis    Repeat labs today Finish abx      Relevant Orders   PSA   POCT Urinalysis Dipstick (Automated)   Essential hypertension    Well controlled, no changes to meds. Encouraged heart healthy diet such as the DASH diet and exercise as tolerated.       Hyperlipidemia LDL goal <100    Tolerating statin, encouraged heart healthy diet, avoid trans fats, minimize simple carbs and saturated fats. Increase exercise as tolerated      Relevant Orders   Lipid panel   Comprehensive metabolic panel      Follow-up: Return in about 1 year (around 05/24/2018), or if symptoms worsen or fail to improve, for annual exam, fasting.  Ann Held, DO

## 2017-05-24 NOTE — Patient Instructions (Signed)
Preventive Dental Care, Adult Preventive dental care includes seeing a dentist regularly and practicing good dental care (oral hygiene) at home. These actions can help to prevent cavities and other tooth problems, root canal problems, gum disease (gingivitis), and tooth loss. Regular dental exams may also help your health care provider to diagnose other medical problems. Many diseases, including mouth cancers, have early signs that can be found during a preventive dental care visit. What can I expect during my dental visits? Many adults see their dentist one or two times each year for oral exams and cleanings. Talk with your dentist about the best preventive dental care schedule for you. At your visit, your dentist may ask you about:  Your overall health and diet.  Any new symptoms, such as: ? Bleeding gums. ? Mouth, tooth, or jaw pain.  Your dentist will do a mouth (oral) exam to check for:  Cavities.  Gingivitis or other problems.  Signs of cancer.  Neck swelling or lumps.  Abnormal jaw movement or pain in the jaw joint.  You may also have:  Dental X-rays.  Your teeth cleaned.  If you have an early problem, like a cavity, your dentist will schedule time for you to get treatment. If you have a tooth root problem, gum disease, or a sign of another disease, your dentist may send you to see another health care provider for care. How can I care for my teeth at home?  Brush with an approved fluoride toothpaste every morning and night. If possible, brush within 10 minutes after every meal.  Floss one time every day.  Periodically check your teeth for white or brown spots after brushing. These may be signs of cavities.  Check your gums for swelling or bleeding. These may be signs of gum disease, such as gingivitis or periodontitis.  Make sure your diet includes plenty of fruits, vegetables, milk and dairy products, whole grains, and proteins. Do not eat a lot of starchy foods or  foods with added sugar. Talk with your health care provider if you have questions about following a healthy diet.  Avoid sodas, sugary snacks, and sticky candies.  Do not smoke.  Do not get mouth piercings.  If you have tooth or gum pain, gargle with a salt-water mixture 3-4 times per day or as needed. To make a salt-water mixture, completely dissolve -1 tsp of salt in 1 cup of warm water.  Take over-the counter and prescription medicines only as told by your dentist.  If you have a permanent tooth knocked out: ? Find the tooth. ? Pick it up by the top (crown) with a tissue or gauze. ? Wash the tooth for no more than 10 seconds under cold, running water. ? Try to put the tooth back into the gum socket. ? Put the tooth in a glass of milk if you cannot get it back in place. ? Go to your dentist right away. Take the tooth with you. When should I seek medical care? Call your dentist if you have:  Gum, tooth, or jaw pain.  Red, swollen, or bleeding gums.  A tooth or teeth that are very sensitive to hot or cold.  Very bad breath.  A problem with a filling, crown, implant, or denture.  A broken or loose tooth.  A growth or sore in your mouth that is not going away.  Where to find more information: American Dental Association: www.ada.org This information is not intended to replace advice given to you by your   health care provider. Make sure you discuss any questions you have with your health care provider. Document Released: 05/05/2015 Document Revised: 01/20/2016 Document Reviewed: 01/26/2015 Elsevier Interactive Patient Education  2017 Elsevier Inc.  

## 2017-05-24 NOTE — Assessment & Plan Note (Signed)
Repeat labs today Finish abx

## 2017-05-24 NOTE — Assessment & Plan Note (Signed)
Well controlled, no changes to meds. Encouraged heart healthy diet such as the DASH diet and exercise as tolerated.  °

## 2017-05-25 LAB — TSH: TSH: 1.04 u[IU]/mL (ref 0.35–4.50)

## 2017-05-25 LAB — COMPREHENSIVE METABOLIC PANEL
ALT: 17 U/L (ref 0–53)
AST: 23 U/L (ref 0–37)
Albumin: 4.4 g/dL (ref 3.5–5.2)
Alkaline Phosphatase: 60 U/L (ref 39–117)
BUN: 20 mg/dL (ref 6–23)
CHLORIDE: 97 meq/L (ref 96–112)
CO2: 32 mEq/L (ref 19–32)
Calcium: 9.8 mg/dL (ref 8.4–10.5)
Creatinine, Ser: 1.05 mg/dL (ref 0.40–1.50)
GFR: 76.02 mL/min (ref >60.00)
GLUCOSE: 85 mg/dL (ref 70–99)
POTASSIUM: 4.2 meq/L (ref 3.5–5.1)
SODIUM: 137 meq/L (ref 135–145)
Total Bilirubin: 0.9 mg/dL (ref 0.2–1.2)
Total Protein: 6.8 g/dL (ref 6.0–8.3)

## 2017-05-25 LAB — LIPID PANEL
CHOLESTEROL: 145 mg/dL (ref 0–200)
HDL: 62.5 mg/dL (ref >39.00)
LDL CALC: 72 mg/dL (ref 0–99)
NonHDL: 82.35
Total CHOL/HDL Ratio: 2
Triglycerides: 51 mg/dL (ref 0.0–149.0)
VLDL: 10.2 mg/dL (ref 0.0–40.0)

## 2017-05-25 LAB — CBC WITH DIFFERENTIAL/PLATELET
BASOS ABS: 0.1 10*3/uL (ref 0.0–0.1)
Basophils Relative: 1.1 % (ref 0.0–3.0)
EOS PCT: 4.5 % (ref 0.0–5.0)
Eosinophils Absolute: 0.3 10*3/uL (ref 0.0–0.7)
HCT: 42.7 % (ref 39.0–52.0)
HEMOGLOBIN: 14.3 g/dL (ref 13.0–17.0)
Lymphocytes Relative: 33.2 % (ref 12.0–46.0)
Lymphs Abs: 1.9 10*3/uL (ref 0.7–4.0)
MCHC: 33.4 g/dL (ref 30.0–36.0)
MCV: 98 fl (ref 78.0–100.0)
MONO ABS: 0.4 10*3/uL (ref 0.1–1.0)
MONOS PCT: 7.3 % (ref 3.0–12.0)
NEUTROS PCT: 53.9 % (ref 43.0–77.0)
Neutro Abs: 3.2 10*3/uL (ref 1.4–7.7)
Platelets: 218 10*3/uL (ref 150.0–400.0)
RBC: 4.36 Mil/uL (ref 4.22–5.81)
RDW: 12.7 % (ref 11.5–15.5)
WBC: 5.8 10*3/uL (ref 4.0–10.5)

## 2017-05-25 LAB — PSA: PSA: 4.38 ng/mL — AB (ref 0.10–4.00)

## 2017-05-25 LAB — URIC ACID: URIC ACID, SERUM: 4.3 mg/dL (ref 4.0–7.8)

## 2017-06-21 DIAGNOSIS — D1801 Hemangioma of skin and subcutaneous tissue: Secondary | ICD-10-CM | POA: Diagnosis not present

## 2017-06-21 DIAGNOSIS — D225 Melanocytic nevi of trunk: Secondary | ICD-10-CM | POA: Diagnosis not present

## 2017-06-21 DIAGNOSIS — L291 Pruritus scroti: Secondary | ICD-10-CM | POA: Diagnosis not present

## 2017-06-21 DIAGNOSIS — L309 Dermatitis, unspecified: Secondary | ICD-10-CM | POA: Diagnosis not present

## 2017-06-25 DIAGNOSIS — L239 Allergic contact dermatitis, unspecified cause: Secondary | ICD-10-CM | POA: Diagnosis not present

## 2017-06-29 DIAGNOSIS — L239 Allergic contact dermatitis, unspecified cause: Secondary | ICD-10-CM | POA: Diagnosis not present

## 2017-07-17 ENCOUNTER — Other Ambulatory Visit: Payer: Self-pay | Admitting: Family Medicine

## 2017-07-17 DIAGNOSIS — M109 Gout, unspecified: Secondary | ICD-10-CM

## 2017-09-03 ENCOUNTER — Telehealth: Payer: Self-pay | Admitting: Family Medicine

## 2017-09-03 ENCOUNTER — Other Ambulatory Visit: Payer: Self-pay | Admitting: Family Medicine

## 2017-09-03 DIAGNOSIS — N529 Male erectile dysfunction, unspecified: Secondary | ICD-10-CM

## 2017-09-03 MED ORDER — SILDENAFIL CITRATE 100 MG PO TABS: 50.0000 mg | ORAL_TABLET | Freq: Every day | ORAL | 1 refills | Status: DC | PRN

## 2017-09-03 NOTE — Telephone Encounter (Signed)
sent 

## 2017-09-03 NOTE — Telephone Encounter (Signed)
Copied from [id number]. Topic: Quick Communication - See Telephone Encounter >> Sep 03, 2017  2:57 PM Conception Chancy, NT wrote: CRM for notification. See Telephone encounter for:  09/03/17.  Patient is calling and requesting a RX be sent in for sildenafil 100mg . Pt states Lowne has prescribed before. Please advise  [facility] in [facility].

## 2017-09-03 NOTE — Telephone Encounter (Signed)
Copied from [place] [id number]. Topic: Quick Communication - See Telephone Encounter >> Sep 03, 2017  2:56 PM Bea Graff, NT wrote: CRM for notification. See Telephone encounter for: [facility] calling requesting rx refill for pt of sildenafil (VIAGRA).   09/03/17.

## 2017-09-04 ENCOUNTER — Other Ambulatory Visit: Payer: Self-pay | Admitting: Family Medicine

## 2017-09-04 DIAGNOSIS — N529 Male erectile dysfunction, unspecified: Secondary | ICD-10-CM

## 2017-09-04 MED ORDER — SILDENAFIL CITRATE 20 MG PO TABS: ORAL_TABLET | ORAL | 0 refills | Status: DC

## 2017-09-04 NOTE — Telephone Encounter (Signed)
Pt wants a lower dosage of the Rx, preferably 20mg  and still get 100 tablets, contact pharmacy or pt if needed

## 2017-09-04 NOTE — Addendum Note (Signed)
Addended by: Kem Boroughs D on: 09/04/2017 06:50 PM   Modules accepted: Orders

## 2017-09-04 NOTE — Telephone Encounter (Signed)
Left message on machine that rx was sent

## 2017-09-04 NOTE — Telephone Encounter (Signed)
New rx ok per Dr. Etter Sjogren.  Rx sent and patient notified.

## 2017-09-16 ENCOUNTER — Other Ambulatory Visit: Payer: Self-pay | Admitting: Family Medicine

## 2017-09-24 ENCOUNTER — Other Ambulatory Visit: Payer: Self-pay | Admitting: Family Medicine

## 2017-09-24 DIAGNOSIS — E785 Hyperlipidemia, unspecified: Secondary | ICD-10-CM

## 2017-12-15 ENCOUNTER — Other Ambulatory Visit: Payer: Self-pay | Admitting: Family Medicine

## 2017-12-15 DIAGNOSIS — M109 Gout, unspecified: Secondary | ICD-10-CM

## 2017-12-16 ENCOUNTER — Other Ambulatory Visit: Payer: Self-pay | Admitting: Family Medicine

## 2018-02-22 ENCOUNTER — Other Ambulatory Visit: Payer: Self-pay | Admitting: Family Medicine

## 2018-02-22 DIAGNOSIS — E785 Hyperlipidemia, unspecified: Secondary | ICD-10-CM

## 2018-03-18 ENCOUNTER — Other Ambulatory Visit: Payer: Self-pay | Admitting: Family Medicine

## 2018-03-20 ENCOUNTER — Other Ambulatory Visit: Payer: Self-pay | Admitting: Family Medicine

## 2018-03-20 DIAGNOSIS — M109 Gout, unspecified: Secondary | ICD-10-CM

## 2018-05-22 ENCOUNTER — Other Ambulatory Visit: Payer: Self-pay | Admitting: Family Medicine

## 2018-05-22 DIAGNOSIS — E785 Hyperlipidemia, unspecified: Secondary | ICD-10-CM

## 2018-05-31 ENCOUNTER — Encounter: Payer: BLUE CROSS/BLUE SHIELD | Admitting: Family Medicine

## 2018-06-16 ENCOUNTER — Other Ambulatory Visit: Payer: Self-pay | Admitting: Family Medicine

## 2018-06-16 DIAGNOSIS — M109 Gout, unspecified: Secondary | ICD-10-CM

## 2018-08-30 ENCOUNTER — Ambulatory Visit (INDEPENDENT_AMBULATORY_CARE_PROVIDER_SITE_OTHER): Payer: BLUE CROSS/BLUE SHIELD | Admitting: Family Medicine

## 2018-08-30 ENCOUNTER — Encounter: Payer: Self-pay | Admitting: Family Medicine

## 2018-08-30 VITALS — BP 102/66 | HR 64 | Temp 97.5°F | Resp 16 | Ht 65.8 in | Wt 144.4 lb

## 2018-08-30 DIAGNOSIS — Z0000 Encounter for general adult medical examination without abnormal findings: Secondary | ICD-10-CM

## 2018-08-30 DIAGNOSIS — J9801 Acute bronchospasm: Secondary | ICD-10-CM

## 2018-08-30 DIAGNOSIS — E785 Hyperlipidemia, unspecified: Secondary | ICD-10-CM | POA: Diagnosis not present

## 2018-08-30 DIAGNOSIS — Z125 Encounter for screening for malignant neoplasm of prostate: Secondary | ICD-10-CM

## 2018-08-30 DIAGNOSIS — I10 Essential (primary) hypertension: Secondary | ICD-10-CM | POA: Diagnosis not present

## 2018-08-30 DIAGNOSIS — Z1211 Encounter for screening for malignant neoplasm of colon: Secondary | ICD-10-CM | POA: Diagnosis not present

## 2018-08-30 LAB — CBC WITH DIFFERENTIAL/PLATELET
BASOS ABS: 0 10*3/uL (ref 0.0–0.1)
Basophils Relative: 0.7 % (ref 0.0–3.0)
EOS PCT: 6.7 % — AB (ref 0.0–5.0)
Eosinophils Absolute: 0.3 10*3/uL (ref 0.0–0.7)
HCT: 44.4 % (ref 39.0–52.0)
HEMOGLOBIN: 15 g/dL (ref 13.0–17.0)
Lymphocytes Relative: 50.8 % — ABNORMAL HIGH (ref 12.0–46.0)
Lymphs Abs: 2.6 10*3/uL (ref 0.7–4.0)
MCHC: 33.7 g/dL (ref 30.0–36.0)
MCV: 97.8 fl (ref 78.0–100.0)
MONO ABS: 0.4 10*3/uL (ref 0.1–1.0)
Monocytes Relative: 8.5 % (ref 3.0–12.0)
Neutro Abs: 1.7 10*3/uL (ref 1.4–7.7)
Neutrophils Relative %: 33.3 % — ABNORMAL LOW (ref 43.0–77.0)
Platelets: 197 10*3/uL (ref 150.0–400.0)
RBC: 4.54 Mil/uL (ref 4.22–5.81)
RDW: 12.8 % (ref 11.5–15.5)
WBC: 5.1 10*3/uL (ref 4.0–10.5)

## 2018-08-30 LAB — POC HEMOCCULT BLD/STL (OFFICE/1-CARD/DIAGNOSTIC)
Card #1 Date: 1032020
FECAL OCCULT BLD: NEGATIVE

## 2018-08-30 LAB — COMPREHENSIVE METABOLIC PANEL
ALBUMIN: 4.5 g/dL (ref 3.5–5.2)
ALK PHOS: 48 U/L (ref 39–117)
ALT: 15 U/L (ref 0–53)
AST: 24 U/L (ref 0–37)
BILIRUBIN TOTAL: 1.2 mg/dL (ref 0.2–1.2)
BUN: 22 mg/dL (ref 6–23)
CO2: 32 mEq/L (ref 19–32)
CREATININE: 0.95 mg/dL (ref 0.40–1.50)
Calcium: 9.6 mg/dL (ref 8.4–10.5)
Chloride: 100 mEq/L (ref 96–112)
GFR: 84.98 mL/min (ref >60.00)
GLUCOSE: 88 mg/dL (ref 70–99)
POTASSIUM: 4.1 meq/L (ref 3.5–5.1)
SODIUM: 138 meq/L (ref 135–145)
TOTAL PROTEIN: 6.7 g/dL (ref 6.0–8.3)

## 2018-08-30 LAB — LIPID PANEL
CHOL/HDL RATIO: 2
Cholesterol: 178 mg/dL (ref 0–200)
HDL: 71.7 mg/dL (ref >39.00)
LDL Cholesterol: 90 mg/dL (ref 0–99)
NONHDL: 106.12
Triglycerides: 82 mg/dL (ref 0.0–149.0)
VLDL: 16.4 mg/dL (ref 0.0–40.0)

## 2018-08-30 LAB — TSH: TSH: 1.73 u[IU]/mL (ref 0.35–4.50)

## 2018-08-30 LAB — PSA: PSA: 1.11 ng/mL (ref 0.10–4.00)

## 2018-08-30 MED ORDER — ALBUTEROL SULFATE HFA 108 (90 BASE) MCG/ACT IN AERS: 2.0000 | INHALATION_SPRAY | Freq: Four times a day (QID) | RESPIRATORY_TRACT | 5 refills | Status: DC | PRN

## 2018-08-30 NOTE — Progress Notes (Signed)
Patient ID: Thomas Levy, male    DOB: 1956-07-19  Age: 63 y.o. MRN: [id number]    Subjective:  Subjective  HPI Thomas Levy presents for cpe and labs.  No complaints   Review of Systems  Constitutional: Negative for chills and fever.  HENT: Negative for congestion and hearing loss.   Eyes: Negative for discharge.  Respiratory: Negative for cough and shortness of breath.   Cardiovascular: Negative for chest pain, palpitations and leg swelling.  Gastrointestinal: Negative for abdominal pain, blood in stool, constipation, diarrhea, nausea and vomiting.  Genitourinary: Negative for dysuria, frequency, hematuria and urgency.  Musculoskeletal: Negative for back pain and myalgias.  Skin: Negative for rash.  Allergic/Immunologic: Negative for environmental allergies.  Neurological: Negative for dizziness, weakness and headaches.  Hematological: Does not bruise/bleed easily.  Psychiatric/Behavioral: Negative for suicidal ideas. The patient is not nervous/anxious.     History Past Medical History:  Diagnosis Date  . Hyperlipidemia   . Hypertension     He has a past surgical history that includes Hernia repair and Vasectomy.   His family history includes Cancer in his mother; Hyperlipidemia in his brother; Hypertension in his brother and mother; Kidney disease in his father.He reports that he has been smoking cigars. He started smoking about 10 years ago. He has never used smokeless tobacco. He reports current alcohol use of about 7.0 standard drinks of alcohol per week. He reports that he does not use drugs.  Current Outpatient Medications on File Prior to Visit  Medication Sig Dispense Refill  . allopurinol (ZYLOPRIM) 300 MG tablet TAKE 1 TABLET BY MOUTH DAILY 90 tablet 2  . allopurinol (ZYLOPRIM) 300 MG tablet TAKE 1 TABLET BY MOUTH EVERY DAY 90 tablet 0  . atorvastatin (LIPITOR) 20 MG tablet Take 1 tablet (20 mg total) by mouth at bedtime. 90 tablet 1  .  clotrimazole-betamethasone (LOTRISONE) lotion Apply topically 2 (two) times daily. x's 14 days 30 mL 0  . lisinopril-hydrochlorothiazide (PRINZIDE,ZESTORETIC) 20-12.5 MG tablet Take 2 tablets by mouth daily. 180 tablet 1  . nystatin (MYCOSTATIN/NYSTOP) powder Apply topically 4 (four) times daily. 15 g 0  . sildenafil (REVATIO) 20 MG tablet Take 1 to 5 tablets one hour prior to intercourse as needed 100 tablet 0   No current facility-administered medications on file prior to visit.      Objective:  Objective  Physical Exam Vitals signs and nursing note reviewed.  Constitutional:      General: He is sleeping. He is not in acute distress.    Appearance: He is well-developed. He is not diaphoretic.  HENT:     Head: Normocephalic and atraumatic.     Right Ear: External ear normal.     Left Ear: External ear normal.     Nose: Nose normal.     Mouth/Throat:     Pharynx: No oropharyngeal exudate.  Eyes:     General:        Right eye: No discharge.        Left eye: No discharge.     Conjunctiva/sclera: Conjunctivae normal.     Pupils: Pupils are equal, round, and reactive to light.  Neck:     Musculoskeletal: Normal range of motion and neck supple.     Thyroid: No thyromegaly.     Vascular: No JVD.  Cardiovascular:     Rate and Rhythm: Normal rate and regular rhythm.     Heart sounds: Normal heart sounds. No murmur.  Pulmonary:     Effort:  Pulmonary effort is normal. No respiratory distress.     Breath sounds: Normal breath sounds. No wheezing or rales.  Chest:     Chest wall: No tenderness.  Abdominal:     General: Bowel sounds are normal. There is no distension.     Palpations: Abdomen is soft. There is no mass.     Tenderness: There is no abdominal tenderness. There is no guarding or rebound.     Hernia: There is no hernia in the right inguinal area or left inguinal area.  Genitourinary:    Penis: Normal.      Scrotum/Testes: Normal.     Prostate: Normal.     Rectum: Guaiac  result negative. External hemorrhoid present.  Musculoskeletal: Normal range of motion.        General: No tenderness.  Lymphadenopathy:     Cervical: No cervical adenopathy.  Skin:    General: Skin is warm and dry.     Findings: No erythema or rash.  Neurological:     Mental Status: He is oriented to person, place, and time.     Cranial Nerves: No cranial nerve deficit.     Motor: No abnormal muscle tone.     Deep Tendon Reflexes: Reflexes are normal and symmetric. Reflexes normal.  Psychiatric:        Behavior: Behavior normal.        Thought Content: Thought content normal.        Judgment: Judgment normal.    BP 102/66 (BP Location: Left Arm, Cuff Size: Normal)   Pulse 64   Temp (!) 97.5 F (36.4 C) (Oral)   Resp 16   Ht 5' 5.8" (1.671 m)   Wt 144 lb 6.4 oz (65.5 kg)   SpO2 98%   BMI 23.45 kg/m  Wt Readings from Last 3 Encounters:  08/30/18 144 lb 6.4 oz (65.5 kg)  05/24/17 141 lb (64 kg)  05/03/17 144 lb 6.4 oz (65.5 kg)     Lab Results  Component Value Date   WBC 5.1 08/30/2018   HGB 15.0 08/30/2018   HCT 44.4 08/30/2018   PLT 197.0 08/30/2018   GLUCOSE 88 08/30/2018   CHOL 178 08/30/2018   TRIG 82.0 08/30/2018   HDL 71.70 08/30/2018   LDLCALC 90 08/30/2018   ALT 15 08/30/2018   AST 24 08/30/2018   NA 138 08/30/2018   K 4.1 08/30/2018   CL 100 08/30/2018   CREATININE 0.95 08/30/2018   BUN 22 08/30/2018   CO2 32 08/30/2018   TSH 1.73 08/30/2018   PSA 1.11 08/30/2018    No results found.   Assessment & Plan:  Plan  I have discontinued Thomas Levy's hydrocortisone-pramoxine and ciprofloxacin. I am also having him maintain his clotrimazole-betamethasone, allopurinol, nystatin, sildenafil, lisinopril-hydrochlorothiazide, allopurinol, atorvastatin, and albuterol.  Meds ordered this encounter  Medications  . albuterol (PROAIR HFA) 108 (90 Base) MCG/ACT inhaler    Sig: Inhale 2 puffs into the lungs every 6 (six) hours as needed for wheezing or  shortness of breath.    Dispense:  18 g    Refill:  5    Problem List Items Addressed This Visit      Unprioritized   Essential hypertension    Well controlled, no changes to meds. Encouraged heart healthy diet such as the DASH diet and exercise as tolerated.       Relevant Orders   Lipid panel (Completed)   Hyperlipidemia LDL goal <100    Encouraged heart healthy diet, increase  exercise, avoid trans fats, consider a krill oil cap daily      Relevant Orders   Lipid panel (Completed)   Comprehensive metabolic panel (Completed)   Preventative health care - Primary    ghm utd Check labs  See AVS      Relevant Orders   TSH (Completed)   Lipid panel (Completed)   CBC with Differential/Platelet (Completed)   Comprehensive metabolic panel (Completed)   PSA (Completed)    Other Visit Diagnoses    Bronchospasm       Relevant Medications   albuterol (PROAIR HFA) 108 (90 Base) MCG/ACT inhaler      Follow-up: Return in about 6 months (around 02/28/2019), or if symptoms worsen or fail to improve, for hypertension, hyperlipidemia.  Ann Held, DO

## 2018-08-30 NOTE — Assessment & Plan Note (Signed)
ghm utd Check labs See AVS 

## 2018-08-30 NOTE — Addendum Note (Signed)
Addended by: Kem Boroughs D on: 08/30/2018 02:35 PM   Modules accepted: Orders

## 2018-08-30 NOTE — Patient Instructions (Signed)
Preventive Care 40-64 Years, Male Preventive care refers to lifestyle choices and visits with your health care provider that can promote health and wellness. What does preventive care include?   A yearly physical exam. This is also called an annual well check.  Dental exams once or twice a year.  Routine eye exams. Ask your health care provider how often you should have your eyes checked.  Personal lifestyle choices, including: ? Daily care of your teeth and gums. ? Regular physical activity. ? Eating a healthy diet. ? Avoiding tobacco and drug use. ? Limiting alcohol use. ? Practicing safe sex. ? Taking low-dose aspirin every day starting at age 50. What happens during an annual well check? The services and screenings done by your health care provider during your annual well check will depend on your age, overall health, lifestyle risk factors, and family history of disease. Counseling Your health care provider may ask you questions about your:  Alcohol use.  Tobacco use.  Drug use.  Emotional well-being.  Home and relationship well-being.  Sexual activity.  Eating habits.  Work and work environment. Screening You may have the following tests or measurements:  Height, weight, and BMI.  Blood pressure.  Lipid and cholesterol levels. These may be checked every 5 years, or more frequently if you are over 50 years old.  Skin check.  Lung cancer screening. You may have this screening every year starting at age 55 if you have a 30-pack-year history of smoking and currently smoke or have quit within the past 15 years.  Colorectal cancer screening. All adults should have this screening starting at age 50 and continuing until age 75. Your health care provider may recommend screening at age 45. You will have tests every 1-10 years, depending on your results and the type of screening test. People at increased risk should start screening at an earlier age. Screening tests may  include: ? Guaiac-based fecal occult blood testing. ? Fecal immunochemical test (FIT). ? Stool DNA test. ? Virtual colonoscopy. ? Sigmoidoscopy. During this test, a flexible tube with a tiny camera (sigmoidoscope) is used to examine your rectum and lower colon. The sigmoidoscope is inserted through your anus into your rectum and lower colon. ? Colonoscopy. During this test, a long, thin, flexible tube with a tiny camera (colonoscope) is used to examine your entire colon and rectum.  Prostate cancer screening. Recommendations will vary depending on your family history and other risks.  Hepatitis C blood test.  Hepatitis B blood test.  Sexually transmitted disease (STD) testing.  Diabetes screening. This is done by checking your blood sugar (glucose) after you have not eaten for a while (fasting). You may have this done every 1-3 years. Discuss your test results, treatment options, and if necessary, the need for more tests with your health care provider. Vaccines Your health care provider may recommend certain vaccines, such as:  Influenza vaccine. This is recommended every year.  Tetanus, diphtheria, and acellular pertussis (Tdap, Td) vaccine. You may need a Td booster every 10 years.  Varicella vaccine. You may need this if you have not been vaccinated.  Zoster vaccine. You may need this after age 60.  Measles, mumps, and rubella (MMR) vaccine. You may need at least one dose of MMR if you were born in 1957 or later. You may also need a second dose.  Pneumococcal 13-valent conjugate (PCV13) vaccine. You may need this if you have certain conditions and have not been vaccinated.  Pneumococcal polysaccharide (PPSV23) vaccine.   You may need one or two doses if you smoke cigarettes or if you have certain conditions.  Meningococcal vaccine. You may need this if you have certain conditions.  Hepatitis A vaccine. You may need this if you have certain conditions or if you travel or work in  places where you may be exposed to hepatitis A.  Hepatitis B vaccine. You may need this if you have certain conditions or if you travel or work in places where you may be exposed to hepatitis B.  Haemophilus influenzae type b (Hib) vaccine. You may need this if you have certain risk factors. Talk to your health care provider about which screenings and vaccines you need and how often you need them. This information is not intended to replace advice given to you by your health care provider. Make sure you discuss any questions you have with your health care provider. Document Released: 09/10/2015 Document Revised: 10/04/2017 Document Reviewed: 06/15/2015 Elsevier Interactive Patient Education  2019 [employer]  

## 2018-08-30 NOTE — Assessment & Plan Note (Signed)
Well controlled, no changes to meds. Encouraged heart healthy diet such as the DASH diet and exercise as tolerated.  °

## 2018-08-30 NOTE — Assessment & Plan Note (Signed)
Encouraged heart healthy diet, increase exercise, avoid trans fats, consider a krill oil cap daily 

## 2018-09-11 ENCOUNTER — Other Ambulatory Visit: Payer: Self-pay | Admitting: Family Medicine

## 2018-09-11 DIAGNOSIS — M109 Gout, unspecified: Secondary | ICD-10-CM

## 2018-11-04 ENCOUNTER — Other Ambulatory Visit: Payer: Self-pay | Admitting: Family Medicine

## 2018-11-04 MED ORDER — SILDENAFIL CITRATE 20 MG PO TABS: ORAL_TABLET | ORAL | 0 refills | Status: DC

## 2019-01-01 ENCOUNTER — Other Ambulatory Visit: Payer: Self-pay | Admitting: *Deleted

## 2019-01-01 MED ORDER — LISINOPRIL-HYDROCHLOROTHIAZIDE 20-12.5 MG PO TABS: 2.0000 | ORAL_TABLET | Freq: Every day | ORAL | 1 refills | Status: DC

## 2019-02-21 ENCOUNTER — Other Ambulatory Visit: Payer: Self-pay | Admitting: Family Medicine

## 2019-02-21 DIAGNOSIS — E785 Hyperlipidemia, unspecified: Secondary | ICD-10-CM

## 2019-02-24 ENCOUNTER — Encounter: Payer: Self-pay | Admitting: Family Medicine

## 2019-02-25 ENCOUNTER — Other Ambulatory Visit: Payer: Self-pay

## 2019-02-25 DIAGNOSIS — E785 Hyperlipidemia, unspecified: Secondary | ICD-10-CM

## 2019-02-25 MED ORDER — ATORVASTATIN CALCIUM 20 MG PO TABS: 20.0000 mg | ORAL_TABLET | Freq: Every day | ORAL | 1 refills | Status: DC

## 2019-03-07 ENCOUNTER — Ambulatory Visit: Payer: BLUE CROSS/BLUE SHIELD | Admitting: Family Medicine

## 2019-05-08 ENCOUNTER — Encounter: Payer: Self-pay | Admitting: Family Medicine

## 2019-05-08 DIAGNOSIS — M109 Gout, unspecified: Secondary | ICD-10-CM

## 2019-05-09 MED ORDER — ALLOPURINOL 300 MG PO TABS: 300.0000 mg | ORAL_TABLET | Freq: Every day | ORAL | 1 refills | Status: DC

## 2019-06-24 ENCOUNTER — Other Ambulatory Visit: Payer: Self-pay | Admitting: Family Medicine

## 2019-09-05 ENCOUNTER — Ambulatory Visit (INDEPENDENT_AMBULATORY_CARE_PROVIDER_SITE_OTHER): Payer: BC Managed Care – PPO | Admitting: Family Medicine

## 2019-09-05 ENCOUNTER — Other Ambulatory Visit: Payer: Self-pay

## 2019-09-05 ENCOUNTER — Encounter: Payer: Self-pay | Admitting: Family Medicine

## 2019-09-05 VITALS — Ht 65.8 in | Wt 148.0 lb

## 2019-09-05 DIAGNOSIS — M109 Gout, unspecified: Secondary | ICD-10-CM | POA: Diagnosis not present

## 2019-09-05 DIAGNOSIS — I10 Essential (primary) hypertension: Secondary | ICD-10-CM

## 2019-09-05 DIAGNOSIS — J9801 Acute bronchospasm: Secondary | ICD-10-CM

## 2019-09-05 DIAGNOSIS — E785 Hyperlipidemia, unspecified: Secondary | ICD-10-CM

## 2019-09-05 DIAGNOSIS — Z0000 Encounter for general adult medical examination without abnormal findings: Secondary | ICD-10-CM

## 2019-09-05 MED ORDER — ALLOPURINOL 300 MG PO TABS: 300.0000 mg | ORAL_TABLET | Freq: Every day | ORAL | 1 refills | Status: DC

## 2019-09-05 MED ORDER — ALBUTEROL SULFATE HFA 108 (90 BASE) MCG/ACT IN AERS: 2.0000 | INHALATION_SPRAY | Freq: Four times a day (QID) | RESPIRATORY_TRACT | 5 refills | Status: DC | PRN

## 2019-09-05 NOTE — Progress Notes (Signed)
Virtual Visit via Video Note  I connected with Thomas Levy on 09/05/19 at  8:40 AM EST by a video enabled telemedicine application and verified that I am speaking with the correct person using two identifiers.  Location: Patient:  Provider: home    I discussed the limitations of evaluation and management by telemedicine and the availability of in person appointments. The patient expressed understanding and agreed to proceed.  History of Present Illness: Pt is home with no complaints  He needs some med refills and will schedule labs  He also has some questions about the covid vaccine    Past Medical History:  Diagnosis Date  . Hyperlipidemia   . Hypertension    Current Outpatient Medications on File Prior to Visit  Medication Sig Dispense Refill  . albuterol (PROAIR HFA) 108 (90 Base) MCG/ACT inhaler Inhale 2 puffs into the lungs every 6 (six) hours as needed for wheezing or shortness of breath. 18 g 5  . allopurinol (ZYLOPRIM) 300 MG tablet Take 1 tablet (300 mg total) by mouth daily. 90 tablet 1  . atorvastatin (LIPITOR) 20 MG tablet TAKE 1 TABLET BY MOUTH EVERYDAY AT BEDTIME 90 tablet 1  . atorvastatin (LIPITOR) 20 MG tablet Take 1 tablet (20 mg total) by mouth at bedtime. 90 tablet 1  . clotrimazole-betamethasone (LOTRISONE) lotion Apply topically 2 (two) times daily. x's 14 days 30 mL 0  . lisinopril-hydrochlorothiazide (ZESTORETIC) 20-12.5 MG tablet TAKE 2 TABLETS BY MOUTH EVERY DAY 180 tablet 0  . nystatin (MYCOSTATIN/NYSTOP) powder Apply topically 4 (four) times daily. 15 g 0  . sildenafil (REVATIO) 20 MG tablet TAKE 1 TO 5 TABLETS BY MOUTH ONE HOUR PRIOR TO INTERCOURSE AS NEEDED 100 tablet 0   No current facility-administered medications on file prior to visit.   Social History   Socioeconomic History  . Marital status: Married    Spouse name: Not on file  . Number of children: Not on file  . Years of education: Not on file  . Highest education level: Not on file   Occupational History  . Not on file  Tobacco Use  . Smoking status: Current Some Day Smoker    Types: Cigars    Start date: 08/29/2008  . Smokeless tobacco: Never Used  Substance and Sexual Activity  . Alcohol use: Yes    Alcohol/week: 7.0 standard drinks    Types: 7 Glasses of wine per week  . Drug use: No  . Sexual activity: Yes    Partners: Female  Other Topics Concern  . Not on file  Social History Narrative   Gym-- 4 days a week for 1 hour   Social Determinants of Health   Financial Resource Strain:   . Difficulty of Paying Living Expenses: Not on file  Food Insecurity:   . Worried About Charity fundraiser in the Last Year: Not on file  . Ran Out of Food in the Last Year: Not on file  Transportation Needs:   . Lack of Transportation (Medical): Not on file  . Lack of Transportation (Non-Medical): Not on file  Physical Activity:   . Days of Exercise per Week: Not on file  . Minutes of Exercise per Session: Not on file  Stress:   . Feeling of Stress : Not on file  Social Connections:   . Frequency of Communication with Friends and Family: Not on file  . Frequency of Social Gatherings with Friends and Family: Not on file  . Attends Religious Services: Not  on file  . Active Member of Clubs or Organizations: Not on file  . Attends Archivist Meetings: Not on file  . Marital Status: Not on file  Intimate Partner Violence:   . Fear of Current or Ex-Partner: Not on file  . Emotionally Abused: Not on file  . Physically Abused: Not on file  . Sexually Abused: Not on file   Past Surgical History:  Procedure Laterality Date  . HERNIA REPAIR    . VASECTOMY    No Known Allergies  Observations/Objective: 120/80 No other vitals obtained Pt in NAD    Assessment and Plan: 1. Bronchospasm Stable-- refill meds  - albuterol (PROAIR HFA) 108 (90 Base) MCG/ACT inhaler; Inhale 2 puffs into the lungs every 6 (six) hours as needed for wheezing or shortness of  breath.  Dispense: 18 g; Refill: 5  2. Gout of foot, unspecified cause, unspecified chronicity, unspecified laterality Stable  Check labs  - allopurinol (ZYLOPRIM) 300 MG tablet; Take 1 tablet (300 mg total) by mouth daily.  Dispense: 90 tablet; Refill: 1 - Uric Acid  3. Preventative health care See above Check labs  ghm utd Answered questions about covid vaccine   4. Essential hypertension Well controlled, no changes to meds. Encouraged heart healthy diet such as the DASH diet and exercise as tolerated.  - TSH - CBC with Differential - Comprehensive metabolic panel  5. Hyperlipidemia, unspecified hyperlipidemia type Tolerating statin, encouraged heart healthy diet, avoid trans fats, minimize simple carbs and saturated fats. Increase exercise as tolerated - Lipid panel   Follow Up Instructions:    I discussed the assessment and treatment plan with the patient. The patient was provided an opportunity to ask questions and all were answered. The patient agreed with the plan and demonstrated an understanding of the instructions.   The patient was advised to call back or seek an in-person evaluation if the symptoms worsen or if the condition fails to improve as anticipated.     Ann Held, DO

## 2019-09-12 ENCOUNTER — Encounter: Payer: Self-pay | Admitting: Family Medicine

## 2019-09-20 ENCOUNTER — Other Ambulatory Visit: Payer: Self-pay | Admitting: Family Medicine

## 2019-09-20 DIAGNOSIS — E785 Hyperlipidemia, unspecified: Secondary | ICD-10-CM

## 2020-02-06 ENCOUNTER — Other Ambulatory Visit: Payer: Self-pay | Admitting: Family Medicine

## 2020-04-23 DIAGNOSIS — R69 Illness, unspecified: Secondary | ICD-10-CM | POA: Diagnosis not present

## 2020-04-30 ENCOUNTER — Other Ambulatory Visit: Payer: Self-pay | Admitting: Family Medicine

## 2020-04-30 DIAGNOSIS — E785 Hyperlipidemia, unspecified: Secondary | ICD-10-CM

## 2020-05-11 DIAGNOSIS — R69 Illness, unspecified: Secondary | ICD-10-CM | POA: Diagnosis not present

## 2020-07-01 DIAGNOSIS — L280 Lichen simplex chronicus: Secondary | ICD-10-CM | POA: Diagnosis not present

## 2020-07-01 DIAGNOSIS — L821 Other seborrheic keratosis: Secondary | ICD-10-CM | POA: Diagnosis not present

## 2020-07-01 DIAGNOSIS — L573 Poikiloderma of Civatte: Secondary | ICD-10-CM | POA: Diagnosis not present

## 2020-07-01 DIAGNOSIS — D239 Other benign neoplasm of skin, unspecified: Secondary | ICD-10-CM | POA: Diagnosis not present

## 2020-07-01 DIAGNOSIS — D1801 Hemangioma of skin and subcutaneous tissue: Secondary | ICD-10-CM | POA: Diagnosis not present

## 2020-07-01 DIAGNOSIS — L578 Other skin changes due to chronic exposure to nonionizing radiation: Secondary | ICD-10-CM | POA: Diagnosis not present

## 2020-07-29 ENCOUNTER — Other Ambulatory Visit: Payer: Self-pay | Admitting: Family Medicine

## 2020-09-04 ENCOUNTER — Encounter: Payer: Self-pay | Admitting: Family Medicine

## 2020-09-09 ENCOUNTER — Other Ambulatory Visit: Payer: Self-pay

## 2020-09-10 ENCOUNTER — Encounter: Payer: Self-pay | Admitting: Family Medicine

## 2020-09-10 ENCOUNTER — Ambulatory Visit (INDEPENDENT_AMBULATORY_CARE_PROVIDER_SITE_OTHER): Payer: Medicare HMO | Admitting: Family Medicine

## 2020-09-10 VITALS — BP 98/70 | HR 76 | Temp 98.0°F | Resp 18 | Ht 65.8 in | Wt 145.6 lb

## 2020-09-10 DIAGNOSIS — Z0000 Encounter for general adult medical examination without abnormal findings: Secondary | ICD-10-CM

## 2020-09-10 DIAGNOSIS — M1A09X0 Idiopathic chronic gout, multiple sites, without tophus (tophi): Secondary | ICD-10-CM

## 2020-09-10 DIAGNOSIS — E785 Hyperlipidemia, unspecified: Secondary | ICD-10-CM

## 2020-09-10 DIAGNOSIS — I10 Essential (primary) hypertension: Secondary | ICD-10-CM

## 2020-09-10 NOTE — Progress Notes (Signed)
Subjective:   Thomas Levy is a 65 y.o. male who presents for a Welcome to Medicare exam.   Review of Systems:  Review of Systems  Constitutional: Negative for activity change, appetite change and fatigue.  HENT: Negative for hearing loss, congestion, tinnitus and ear discharge.   Eyes: Negative for visual disturbance (see optho q1y -- vision corrected to 20/20 with glasses).  Respiratory: Negative for cough, chest tightness and shortness of breath.   Cardiovascular: Negative for chest pain, palpitations and leg swelling.  Gastrointestinal: Negative for abdominal pain, diarrhea, constipation and abdominal distention.  Genitourinary: Negative for urgency, frequency, decreased urine volume and difficulty urinating.  Musculoskeletal: Negative for back pain, arthralgias and gait problem.  Skin: Negative for color change, pallor and rash.  Neurological: Negative for dizziness, light-headedness, numbness and headaches.  Hematological: Negative for adenopathy. Does not bruise/bleed easily.  Psychiatric/Behavioral: Negative for suicidal ideas, confusion, sleep disturbance, self-injury, dysphoric mood, decreased concentration and agitation.  Pt is able to read and write and can do all ADLs No risk for falling No abuse/ violence in home         Objective:    Today's Vitals   09/10/20 1306  BP: 98/70  Pulse: 76  Resp: 18  Temp: 98 F (36.7 C)  TempSrc: Oral  SpO2: 94%  Weight: 145 lb 9.6 oz (66 kg)  Height: 5' 5.8" (1.671 m)   Body mass index is 23.64 kg/m.  Medications Outpatient Encounter Medications as of 09/10/2020  Medication Sig  . albuterol (PROAIR HFA) 108 (90 Base) MCG/ACT inhaler Inhale 2 puffs into the lungs every 6 (six) hours as needed for wheezing or shortness of breath.  . allopurinol (ZYLOPRIM) 300 MG tablet Take 1 tablet (300 mg total) by mouth daily.  Marland Kitchen atorvastatin (LIPITOR) 20 MG tablet TAKE 1 TABLET BY MOUTH EVERYDAY AT BEDTIME  .  clotrimazole-betamethasone (LOTRISONE) lotion Apply topically 2 (two) times daily. x's 14 days  . lisinopril-hydrochlorothiazide (ZESTORETIC) 20-12.5 MG tablet TAKE 2 TABLETS BY MOUTH EVERY DAY  . sildenafil (REVATIO) 20 MG tablet TAKE 1 TO 5 TABLETS BY MOUTH ONE HOUR PRIOR TO INTERCOURSE AS NEEDED  . nystatin (MYCOSTATIN/NYSTOP) powder Apply topically 4 (four) times daily. (Patient not taking: Reported on 09/10/2020)   No facility-administered encounter medications on file as of 09/10/2020.     History: Past Medical History:  Diagnosis Date  . Hyperlipidemia   . Hypertension    Past Surgical History:  Procedure Laterality Date  . HERNIA REPAIR    . VASECTOMY      Family History  Problem Relation Age of Onset  . Hypertension Mother   . Cancer Mother        breast  . Kidney disease Father        kidney disease-- on dialysis  . Hypertension Brother   . Hyperlipidemia Brother    Social History   Occupational History  . Not on file  Tobacco Use  . Smoking status: Current Some Day Smoker    Types: Cigars    Start date: 08/29/2008  . Smokeless tobacco: Never Used  Substance and Sexual Activity  . Alcohol use: Yes    Alcohol/week: 7.0 standard drinks    Types: 7 Glasses of wine per week  . Drug use: No  . Sexual activity: Yes    Partners: Female   Tobacco Counseling Ready to quit: Not Answered Counseling given: Not Answered   Immunizations and Health Maintenance Immunization History  Administered Date(s) Administered  . Influenza,inj,Quad  PF,6+ Mos 05/03/2017, 06/05/2018, 05/10/2019  . Influenza-Unspecified 06/05/2018  . PFIZER SARS-COV-2 Vaccination 09/17/2019, 10/08/2019, 05/07/2020  . Tdap 01/19/2012  . Zoster 04/29/2015  . Zoster Recombinat (Shingrix) 06/05/2018, 05/10/2019   Health Maintenance Due  Topic Date Due  . PNA vac Low Risk Adult (1 of 2 - PCV13) Never done  . INFLUENZA VACCINE  03/28/2020    Activities of Daily Living In your present state of  health, do you have any difficulty performing the following activities: 09/10/2020 09/10/2020  Hearing? N N  Vision? N N  Difficulty concentrating or making decisions? N N  Walking or climbing stairs? N N  Dressing or bathing? N N  Doing errands, shopping? N N  Some recent data might be hidden    Physical Exam\  BP 98/70 (BP Location: Right Arm, Patient Position: Sitting, Cuff Size: Normal)   Pulse 76   Temp 98 F (36.7 C) (Oral)   Resp 18   Ht 5' 5.8" (1.671 m)   Wt 145 lb 9.6 oz (66 kg)   SpO2 94%   BMI 23.64 kg/m  General appearance: alert, cooperative, appears stated age and no distress Head: Normocephalic, without obvious abnormality, atraumatic Eyes: negative findings: lids and lashes normal Ears: normal TM's and external ear canals both ears Neck: no adenopathy, no carotid bruit, no JVD, supple, symmetrical, trachea midline and thyroid not enlarged, symmetric, no tenderness/mass/nodules Back: symmetric, no curvature. ROM normal. No CVA tenderness. Lungs: clear to auscultation bilaterally Chest wall: no tenderness Heart: regular rate and rhythm, S1, S2 normal, no murmur, click, rub or gallop Abdomen: soft, non-tender; bowel sounds normal; no masses,  no organomegaly Male genitalia: deferred Rectal: deferred Extremities: extremities normal, atraumatic, no cyanosis or edema Pulses: 2+ and symmetric Skin: Skin color, texture, turgor normal. No rashes or lesions Lymph nodes: Cervical, supraclavicular, and axillary nodes normal. Neurologic: Alert and oriented X 3, normal strength and tone. Normal symmetric reflexes. Normal coordination and gait      (Advanced Directives: Does Patient Have a Medical Advance Directive?: Yes Type of Advance Directive: [place] will Does patient want to make changes to medical advance directive?: No - Patient declined Copy of [place] in Chart?: No - copy requested    Assessment:    This is a  routine wellness  examination for this patient .   Vision/Hearing screen  Hearing Screening   125Hz  250Hz  500Hz  1000Hz  2000Hz  3000Hz  4000Hz  6000Hz  8000Hz   Right ear:           Left ear:           Comments: Normal whisper    Visual Acuity Screening   Right eye Left eye Both eyes  Without correction:     With correction: 20/20 20/20 20/20     Dietary issues and exercise activities discussed:  Current Exercise Habits: Structured exercise class, Time (Minutes): > 60, Frequency (Times/Week): 4, Weekly Exercise (Minutes/Week): 0, Intensity: Moderate, Exercise limited by: None identified  Goals   None     Depression Screen PHQ 2/9 Scores 09/10/2020 09/05/2019 08/30/2018 05/18/2016  PHQ - 2 Score 0 0 0 0  PHQ- 9 Score - - 0 -     Fall Risk Fall Risk  09/10/2020  Falls in the past year? 0  Number falls in past yr: 0  Injury with Fall? 0  Follow up Falls evaluation completed    Cognitive Function MMSE - Mini Mental State Exam 09/10/2020  Orientation to time 5  Orientation to Place  5  Registration 3  Attention/ Calculation 5  Recall 3  Language- name 2 objects 2  Language- repeat 1  Language- follow 3 step command 3  Language- read & follow direction 1  Write a sentence 1  Copy design 1  Total score 30        Patient Care Team: Ann Held, DO as PCP - General (Family Medicine) Dermatology, [facility], Greggory Brandy, MD as Referring Physician (Dermatology) Eusebio Friendly, MD as Referring Physician (Internal Medicine) Agapito Games (Optometry)     Plan:     ghm utd Check labs  See avs I have personally reviewed and noted the following in the patient's chart:   . Medical and social history . Use of alcohol, tobacco or illicit drugs  . Current medications and supplements . Functional ability and status . Nutritional status . Physical activity . Advanced directives . List of other physicians . Hospitalizations, surgeries, and ER visits in  previous 12 months . Vitals . Screenings to include cognitive, depression, and falls . Referrals and appointments  In addition, I have reviewed and discussed with patient certain preventive protocols, quality metrics, and best practice recommendations. A written personalized care plan for preventive services as well as general preventive health recommendations were provided to patient.   1. 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 Hyperlipidemia, unspecified hyperlipidemia type Encouraged heart healthy diet, increase exercise, avoid trans fats, consider a krill oil cap daily - CBC with Differential/Platelet - Comprehensive metabolic panel - Lipid panel  3. Primary hypertension Well controlled, no changes to meds. Encouraged heart healthy diet such as the DASH diet and exercise as tolerated.  - CBC with Differential/Platelet - Comprehensive metabolic panel - Lipid panel - Uric acid  4. Welcome to Medicare preventive visit Check labs   - EKG 12-Lead - PSA   Ann Held, DO 09/10/2020

## 2020-09-10 NOTE — Patient Instructions (Signed)
Preventive Care 65 Years and Older, Male Preventive care refers to lifestyle choices and visits with your health care provider that can promote health and wellness. This includes:  A yearly physical exam. This is also called an annual wellness visit.  Regular dental and eye exams.  Immunizations.  Screening for certain conditions.  Healthy lifestyle choices, such as: ? Eating a healthy diet. ? Getting regular exercise. ? Not using drugs or products that contain nicotine and tobacco. ? Limiting alcohol use. What can I expect for my preventive care visit? Physical exam Your health care provider will check your:  Height and weight. These may be used to calculate your BMI (body mass index). BMI is a measurement that tells if you are at a healthy weight.  Heart rate and blood pressure.  Body temperature.  Skin for abnormal spots. Counseling Your health care provider may ask you questions about your:  Past medical problems.  Family's medical history.  Alcohol, tobacco, and drug use.  Emotional well-being.  Home life and relationship well-being.  Sexual activity.  Diet, exercise, and sleep habits.  History of falls.  Memory and ability to understand (cognition).  Work and work environment.  Access to firearms. What immunizations do I need? Vaccines are usually given at various ages, according to a schedule. Your health care provider will recommend vaccines for you based on your age, medical history, and lifestyle or other factors, such as travel or where you work.   What tests do I need? Blood tests  Lipid and cholesterol levels. These may be checked every 5 years, or more often depending on your overall health.  Hepatitis C test.  Hepatitis B test. Screening  Lung cancer screening. You may have this screening every year starting at age 55 if you have a 30-pack-year history of smoking and currently smoke or have quit within the past 15 years.  Colorectal  cancer screening. ? All adults should have this screening starting at age 50 and continuing until age 75. ? Your health care provider may recommend screening at age 45 if you are at increased risk. ? You will have tests every 1-10 years, depending on your results and the type of screening test.  Prostate cancer screening. Recommendations will vary depending on your family history and other risks.  Genital exam to check for testicular cancer or hernias.  Diabetes screening. ? This is done by checking your blood sugar (glucose) after you have not eaten for a while (fasting). ? You may have this done every 1-3 years.  Abdominal aortic aneurysm (AAA) screening. You may need this if you are a current or former smoker.  STD (sexually transmitted disease) testing, if you are at risk. Follow these instructions at home: Eating and drinking  Eat a diet that includes fresh fruits and vegetables, whole grains, lean protein, and low-fat dairy products. Limit your intake of foods with high amounts of sugar, saturated fats, and salt.  Take vitamin and mineral supplements as recommended by your health care provider.  Do not drink alcohol if your health care provider tells you not to drink.  If you drink alcohol: ? Limit how much you have to 0-2 drinks a day. ? Be aware of how much alcohol is in your drink. In the [place] one drink equals one 12 oz bottle of beer (355 mL), one 5 oz glass of wine (148 mL), or one 1 oz glass of hard liquor (44 mL).   Lifestyle  Take daily care of your teeth   and gums. Brush your teeth every morning and night with fluoride toothpaste. Floss one time each day.  Stay active. Exercise for at least 30 minutes 5 or more days each week.  Do not use any products that contain nicotine or tobacco, such as cigarettes, e-cigarettes, and chewing tobacco. If you need help quitting, ask your health care provider.  Do not use drugs.  If you are sexually active, practice safe sex.  Use a condom or other form of protection to prevent STIs (sexually transmitted infections).  Talk with your health care provider about taking a low-dose aspirin or statin.  Find healthy ways to cope with stress, such as: ? Meditation, yoga, or listening to music. ? Journaling. ? Talking to a trusted person. ? Spending time with friends and family. Safety  Always wear your seat belt while driving or riding in a vehicle.  Do not drive: ? If you have been drinking alcohol. Do not ride with someone who has been drinking. ? When you are tired or distracted. ? While texting.  Wear a helmet and other protective equipment during sports activities.  If you have firearms in your house, make sure you follow all gun safety procedures. What's next?  Visit your health care provider once a year for an annual wellness visit.  Ask your health care provider how often you should have your eyes and teeth checked.  Stay up to date on all vaccines. This information is not intended to replace advice given to you by your health care provider. Make sure you discuss any questions you have with your health care provider. Document Revised: 05/13/2019 Document Reviewed: 08/08/2018 Elsevier Patient Education  2021 Elsevier Inc.  

## 2020-09-11 LAB — CBC WITH DIFFERENTIAL/PLATELET
Absolute Monocytes: 451 cells/uL (ref 200–950)
Basophils Absolute: 37 cells/uL (ref 0–200)
Basophils Relative: 0.5 %
Eosinophils Absolute: 266 cells/uL (ref 15–500)
Eosinophils Relative: 3.6 %
HCT: 48.2 % (ref 38.5–50.0)
Hemoglobin: 16.3 g/dL (ref 13.2–17.1)
Lymphs Abs: 2028 cells/uL (ref 850–3900)
MCH: 32.7 pg (ref 27.0–33.0)
MCHC: 33.8 g/dL (ref 32.0–36.0)
MCV: 96.6 fL (ref 80.0–100.0)
MPV: 10.4 fL (ref 7.5–12.5)
Monocytes Relative: 6.1 %
Neutro Abs: 4618 cells/uL (ref 1500–7800)
Neutrophils Relative %: 62.4 %
Platelets: 185 10*3/uL (ref 140–400)
RBC: 4.99 10*6/uL (ref 4.20–5.80)
RDW: 12.2 % (ref 11.0–15.0)
Total Lymphocyte: 27.4 %
WBC: 7.4 10*3/uL (ref 3.8–10.8)

## 2020-09-11 LAB — COMPREHENSIVE METABOLIC PANEL
AG Ratio: 1.9 (calc) (ref 1.0–2.5)
ALT: 18 U/L (ref 9–46)
AST: 24 U/L (ref 10–35)
Albumin: 4.6 g/dL (ref 3.6–5.1)
Alkaline phosphatase (APISO): 55 U/L (ref 35–144)
BUN: 16 mg/dL (ref 7–25)
CO2: 28 mmol/L (ref 20–32)
Calcium: 9.8 mg/dL (ref 8.6–10.3)
Chloride: 99 mmol/L (ref 98–110)
Creat: 1.03 mg/dL (ref 0.70–1.25)
Globulin: 2.4 g/dL (calc) (ref 1.9–3.7)
Glucose, Bld: 81 mg/dL (ref 65–99)
Potassium: 4.1 mmol/L (ref 3.5–5.3)
Sodium: 138 mmol/L (ref 135–146)
Total Bilirubin: 0.7 mg/dL (ref 0.2–1.2)
Total Protein: 7 g/dL (ref 6.1–8.1)

## 2020-09-11 LAB — URIC ACID: Uric Acid, Serum: 4.9 mg/dL (ref 4.0–8.0)

## 2020-09-11 LAB — LIPID PANEL
Cholesterol: 175 mg/dL (ref <200)
HDL: 70 mg/dL (ref >=40)
LDL Cholesterol (Calc): 89 mg/dL (calc)
Non-HDL Cholesterol (Calc): 105 mg/dL (calc) (ref <130)
Total CHOL/HDL Ratio: 2.5 (calc) (ref <5.0)
Triglycerides: 74 mg/dL (ref <150)

## 2020-09-11 LAB — PSA: PSA: 0.79 ng/mL (ref <=4.0)

## 2020-09-30 DIAGNOSIS — D485 Neoplasm of uncertain behavior of skin: Secondary | ICD-10-CM | POA: Diagnosis not present

## 2020-09-30 DIAGNOSIS — L820 Inflamed seborrheic keratosis: Secondary | ICD-10-CM | POA: Diagnosis not present

## 2020-10-19 ENCOUNTER — Other Ambulatory Visit: Payer: Self-pay | Admitting: Family Medicine

## 2020-10-19 DIAGNOSIS — M109 Gout, unspecified: Secondary | ICD-10-CM

## 2020-11-27 ENCOUNTER — Encounter: Payer: Self-pay | Admitting: Family Medicine

## 2020-12-06 ENCOUNTER — Other Ambulatory Visit: Payer: Self-pay | Admitting: Family Medicine

## 2020-12-06 DIAGNOSIS — J9801 Acute bronchospasm: Secondary | ICD-10-CM

## 2020-12-08 ENCOUNTER — Other Ambulatory Visit: Payer: Self-pay | Admitting: Family Medicine

## 2021-02-02 ENCOUNTER — Encounter: Payer: Self-pay | Admitting: Family Medicine

## 2021-02-06 ENCOUNTER — Encounter: Payer: Self-pay | Admitting: Family Medicine

## 2021-02-07 NOTE — Telephone Encounter (Signed)
Noted  

## 2021-02-10 ENCOUNTER — Ambulatory Visit (INDEPENDENT_AMBULATORY_CARE_PROVIDER_SITE_OTHER): Payer: Medicare HMO | Admitting: Family Medicine

## 2021-02-10 ENCOUNTER — Encounter: Payer: Self-pay | Admitting: Family Medicine

## 2021-02-10 ENCOUNTER — Other Ambulatory Visit: Payer: Self-pay

## 2021-02-10 VITALS — BP 120/80 | HR 66 | Temp 98.2°F | Resp 18 | Ht 66.0 in | Wt 146.6 lb

## 2021-02-10 DIAGNOSIS — E785 Hyperlipidemia, unspecified: Secondary | ICD-10-CM

## 2021-02-10 DIAGNOSIS — R251 Tremor, unspecified: Secondary | ICD-10-CM

## 2021-02-10 DIAGNOSIS — I10 Essential (primary) hypertension: Secondary | ICD-10-CM | POA: Diagnosis not present

## 2021-02-10 LAB — COMPREHENSIVE METABOLIC PANEL
ALT: 15 U/L (ref 0–53)
AST: 22 U/L (ref 0–37)
Albumin: 4.4 g/dL (ref 3.5–5.2)
Alkaline Phosphatase: 58 U/L (ref 39–117)
BUN: 31 mg/dL — ABNORMAL HIGH (ref 6–23)
CO2: 32 mEq/L (ref 19–32)
Calcium: 9.4 mg/dL (ref 8.4–10.5)
Chloride: 100 mEq/L (ref 96–112)
Creatinine, Ser: 1.13 mg/dL (ref 0.40–1.50)
GFR: 68.01 mL/min (ref >60.00)
Glucose, Bld: 90 mg/dL (ref 70–99)
Potassium: 3.9 mEq/L (ref 3.5–5.1)
Sodium: 138 mEq/L (ref 135–145)
Total Bilirubin: 0.9 mg/dL (ref 0.2–1.2)
Total Protein: 6.8 g/dL (ref 6.0–8.3)

## 2021-02-10 LAB — LIPID PANEL
Cholesterol: 170 mg/dL (ref 0–200)
HDL: 64.2 mg/dL (ref >39.00)
LDL Cholesterol: 89 mg/dL (ref 0–99)
NonHDL: 105.43
Total CHOL/HDL Ratio: 3
Triglycerides: 80 mg/dL (ref 0.0–149.0)
VLDL: 16 mg/dL (ref 0.0–40.0)

## 2021-02-10 NOTE — Assessment & Plan Note (Signed)
Tolerating statin, encouraged heart healthy diet, avoid trans fats, minimize simple carbs and saturated fats. Increase exercise as tolerated 

## 2021-02-10 NOTE — Progress Notes (Addendum)
Subjective:   By signing my name below, I, Shehryar Baig, attest that this documentation has been prepared under the direction and in the presence of Dr. Roma Schanz, DO. 02/10/2021    Patient ID: Thomas Levy, male    DOB: 03/22/1956, 65 y.o.   MRN: [id number]  Chief Complaint  Patient presents with   Tremors    In the left arm x 4 years, some worsening. No weakness.    HPI Patient is in today for a office visit. He complains of tremors in his left hand for the past 4 years. During start of tremors he feels light tension in left hand and then notices tremors. His episodes of tremors typically occur while sitting still. Its has worsened since its onset. When he is aware of episode tremors he can consciously stop it. He does not know if he has family history of tremors. He has no other symptoms. He reports that it does not bother him at this time. The tremor comes and goes but is only at rest and only in L hand.   No other complaints  His blood pressure is doing well during this visit. BP Readings from Last 3 Encounters:  02/10/21 120/80  09/10/20 98/70  08/30/18 102/66    Past Medical History:  Diagnosis Date   Hyperlipidemia    Hypertension     Past Surgical History:  Procedure Laterality Date   HERNIA REPAIR     VASECTOMY      Family History  Problem Relation Age of Onset   Hypertension Mother    Cancer Mother        breast   Kidney disease Father        kidney disease-- on dialysis   Hypertension Brother    Hyperlipidemia Brother     Social History   Socioeconomic History   Marital status: Married    Spouse name: Not on file   Number of children: Not on file   Years of education: Not on file   Highest education level: Not on file  Occupational History   Not on file  Tobacco Use   Smoking status: Some Days    Pack years: 0.00    Types: Cigars    Start date: 08/29/2008   Smokeless tobacco: Never  Substance and Sexual Activity   Alcohol use:  Yes    Alcohol/week: 7.0 standard drinks    Types: 7 Glasses of wine per week   Drug use: No   Sexual activity: Yes    Partners: Female  Other Topics Concern   Not on file  Social History Narrative   Gym-- 4 days a week for 1 hour   Social Determinants of Health   Financial Resource Strain: Not on file  Food Insecurity: Not on file  Transportation Needs: Not on file  Physical Activity: Not on file  Stress: Not on file  Social Connections: Not on file  Intimate Partner Violence: Not on file    Outpatient Medications Prior to Visit  Medication Sig Dispense Refill   albuterol (VENTOLIN HFA) 108 (90 Base) MCG/ACT inhaler Inhale 2 puffs into the lungs every 6 (six) hours as needed for wheezing or shortness of breath. 18 g 5   allopurinol (ZYLOPRIM) 300 MG tablet Take 1 tablet (300 mg total) by mouth daily. 90 tablet 1   atorvastatin (LIPITOR) 20 MG tablet TAKE 1 TABLET BY MOUTH EVERYDAY AT BEDTIME 90 tablet 1   clotrimazole-betamethasone (LOTRISONE) lotion Apply topically 2 (two) times daily.  x's 14 days 30 mL 0   lisinopril-hydrochlorothiazide (ZESTORETIC) 20-12.5 MG tablet TAKE 2 TABLETS BY MOUTH EVERY DAY 180 tablet 1   nystatin (MYCOSTATIN/NYSTOP) powder Apply topically 4 (four) times daily. 15 g 0   sildenafil (REVATIO) 20 MG tablet TAKE 1 TO 5 TABLETS BY MOUTH ONE HOUR PRIOR TO INTERCOURSE AS NEEDED 100 tablet 0   No facility-administered medications prior to visit.    No Known Allergies  Review of Systems  Constitutional:  Negative for chills, fever and malaise/fatigue.  HENT:  Negative for congestion and hearing loss.   Eyes:  Negative for discharge.  Respiratory:  Negative for cough, sputum production and shortness of breath.   Cardiovascular:  Negative for chest pain, palpitations and leg swelling.  Gastrointestinal:  Negative for abdominal pain, blood in stool, constipation, diarrhea, heartburn, nausea and vomiting.  Genitourinary:  Negative for dysuria, frequency,  hematuria and urgency.  Musculoskeletal:  Negative for back pain, falls and myalgias.  Skin:  Negative for rash.  Neurological:  Positive for tremors (Left hand). Negative for dizziness, sensory change, loss of consciousness, weakness and headaches.  Endo/Heme/Allergies:  Negative for environmental allergies. Does not bruise/bleed easily.  Psychiatric/Behavioral:  Negative for depression and suicidal ideas. The patient is not nervous/anxious and does not have insomnia.       Objective:    Physical Exam Vitals and nursing note reviewed.  Constitutional:      General: He is not in acute distress.    Appearance: Normal appearance. He is not ill-appearing.  HENT:     Head: Normocephalic and atraumatic.     Right Ear: External ear normal.     Left Ear: External ear normal.  Eyes:     Extraocular Movements: Extraocular movements intact.     Pupils: Pupils are equal, round, and reactive to light.  Cardiovascular:     Rate and Rhythm: Normal rate and regular rhythm.     Pulses: Normal pulses.     Heart sounds: Normal heart sounds. No murmur heard.   No gallop.  Pulmonary:     Effort: Pulmonary effort is normal. No respiratory distress.     Breath sounds: Normal breath sounds. No wheezing, rhonchi or rales.  Skin:    General: Skin is warm and dry.  Neurological:     Mental Status: He is alert and oriented to person, place, and time.     Comments: Minimal tremor noted on finger to nose test on left hand.   Psychiatric:        Behavior: Behavior normal.    BP 120/80 (BP Location: Left Arm, Patient Position: Sitting, Cuff Size: Normal)   Pulse 66   Temp 98.2 F (36.8 C) (Oral)   Resp 18   Ht 5\' 6"  (1.676 m)   Wt 146 lb 9.6 oz (66.5 kg)   SpO2 95%   BMI 23.66 kg/m  Wt Readings from Last 3 Encounters:  02/10/21 146 lb 9.6 oz (66.5 kg)  09/10/20 145 lb 9.6 oz (66 kg)  09/05/19 148 lb (67.1 kg)    Diabetic Foot Exam - Simple   No data filed    Lab Results  Component Value  Date   WBC 7.4 09/10/2020   HGB 16.3 09/10/2020   HCT 48.2 09/10/2020   PLT 185 09/10/2020   GLUCOSE 81 09/10/2020   CHOL 175 09/10/2020   TRIG 74 09/10/2020   HDL 70 09/10/2020   LDLCALC 89 09/10/2020   ALT 18 09/10/2020   AST 24 09/10/2020  NA 138 09/10/2020   K 4.1 09/10/2020   CL 99 09/10/2020   CREATININE 1.03 09/10/2020   BUN 16 09/10/2020   CO2 28 09/10/2020   TSH 1.73 08/30/2018   PSA 0.79 09/10/2020    Lab Results  Component Value Date   TSH 1.73 08/30/2018   Lab Results  Component Value Date   WBC 7.4 09/10/2020   HGB 16.3 09/10/2020   HCT 48.2 09/10/2020   MCV 96.6 09/10/2020   PLT 185 09/10/2020   Lab Results  Component Value Date   NA 138 09/10/2020   K 4.1 09/10/2020   CO2 28 09/10/2020   GLUCOSE 81 09/10/2020   BUN 16 09/10/2020   CREATININE 1.03 09/10/2020   BILITOT 0.7 09/10/2020   ALKPHOS 48 08/30/2018   AST 24 09/10/2020   ALT 18 09/10/2020   PROT 7.0 09/10/2020   ALBUMIN 4.5 08/30/2018   CALCIUM 9.8 09/10/2020   GFR 84.98 08/30/2018   Lab Results  Component Value Date   CHOL 175 09/10/2020   Lab Results  Component Value Date   HDL 70 09/10/2020   Lab Results  Component Value Date   LDLCALC 89 09/10/2020   Lab Results  Component Value Date   TRIG 74 09/10/2020   Lab Results  Component Value Date   CHOLHDL 2.5 09/10/2020   No results found for: HGBA1C     Assessment & Plan:   Problem List Items Addressed This Visit       Unprioritized   Essential hypertension    Well controlled, no changes to meds. Encouraged heart healthy diet such as the DASH diet and exercise as tolerated.        Hyperlipidemia LDL goal <100    Tolerating statin, encouraged heart healthy diet, avoid trans fats, minimize simple carbs and saturated fats. Increase exercise as tolerated       Tremor of left hand - Primary    Pt wishes to see neuro due to tremor worsening of the last few years Still not severe but pt concerned since it s only  in 1 hand and only at rest  Neuro referral placed       Relevant Orders   Ambulatory referral to Neurology   Other Visit Diagnoses     Hyperlipidemia, unspecified hyperlipidemia type       Relevant Orders   Lipid panel   Comprehensive metabolic panel   Primary hypertension       Relevant Orders   Lipid panel   Comprehensive metabolic panel        No orders of the defined types were placed in this encounter.   I, Dr. Roma Schanz, DO, personally preformed the services described in this documentation.  All medical record entries made by the scribe were at my direction and in my presence.  I have reviewed the chart and discharge instructions (if applicable) and agree that the record reflects my personal performance and is accurate and complete. 02/10/2021   I,Shehryar Baig,acting as a Education administrator for [employer], DO.,have documented all relevant documentation on the behalf of Ann Held, DO,as directed by  Ann Held, DO while in the presence of Ann Held, DO.   Ann Held, DO

## 2021-02-10 NOTE — Patient Instructions (Signed)
Tremor A tremor is trembling or shaking that you cannot control. Most tremors affect the hands or arms. Tremors can also affect the head, vocal cords, face, and other parts of the body. There are many types of tremors. Common types include: Essential tremor. These usually occur in people older than 40. It may run in families and can happen in otherwise healthy people. Resting tremor. These occur when the muscles are at rest, such as when your hands are resting in your lap. People with Parkinson's disease often have resting tremors. Postural tremor. These occur when you try to hold a pose, such as keeping your hands outstretched. Kinetic tremor. These occur during purposeful movement, such as trying to touch a finger to your nose. Task-specific tremor. These may occur when you perform certain tasks such as writing, speaking, or standing. Psychogenic tremor. These dramatically lessen or disappear when you are distracted. They can happen in people of all ages. Some types of tremors have no known cause. Tremors can also be a symptom of nervous system problems (neurological disorders) that may occur with aging. Some tremors go away with treatment, while othersdo not. Follow these instructions at home: Lifestyle     Limit alcohol intake to no more than 1 drink a day for nonpregnant women and 2 drinks a day for men. One drink equals 12 oz of beer, 5 oz of wine, or 1 oz of hard liquor. Do not use any products that contain nicotine or tobacco, such as cigarettes and e-cigarettes. If you need help quitting, ask your health care provider. Avoid extreme heat and extreme cold. Limit your caffeine intake, as told by your health care provider. Try to get 8 hours of sleep each night. Find ways to manage your stress, such as meditation or yoga. General instructions Take over-the-counter and prescription medicines only as told by your health care provider. Keep all follow-up visits as told by your health care  provider. This is important. Contact a health care provider if you: Develop a tremor after starting a new medicine. Have a tremor along with other symptoms such as: Numbness. Tingling. Pain. Weakness. Notice that your tremor gets worse. Notice that your tremor interferes with your day-to-day life. Summary A tremor is trembling or shaking that you cannot control. Most tremors affect the hands or arms. Some types of tremors have no known cause. Others may be a symptom of nervous system problems (neurological disorders). Make sure you discuss any tremors you have with your health care provider. This information is not intended to replace advice given to you by your health care provider. Make sure you discuss any questions you have with your healthcare provider. Document Revised: 05/07/2020 Document Reviewed: 05/07/2020 Elsevier Patient Education  2022 [employer].

## 2021-02-10 NOTE — Assessment & Plan Note (Signed)
Well controlled, no changes to meds. Encouraged heart healthy diet such as the DASH diet and exercise as tolerated.  °

## 2021-02-10 NOTE — Assessment & Plan Note (Signed)
Pt wishes to see neuro due to tremor worsening of the last few years Still not severe but pt concerned since it s only in 1 hand and only at rest  Neuro referral placed

## 2021-02-12 ENCOUNTER — Encounter: Payer: Self-pay | Admitting: Family Medicine

## 2021-02-15 NOTE — Progress Notes (Signed)
Assessment/Plan:    Parkinsonian tremor Patient does not currently meet other criteria for Parkinson's disease.  He and I discussed this criteria.  Discussed that he will need to be watched closely in the future for the development of those criteria.  Patient does not, however, have any bradykinesia, which really defines the disease. Discussed the importance of safe, cardiovascular exercise.  Patient really exercises about 2 hours/day, which may be the reason that he is really doing so well.  He and I talked about data on exercise and what types of exercise. We will do TSH since it has been over 2-1/2 years since last done. We talked about DaTscan, but I really do not think that that adds anything to his situation right now. Patient asked appropriate questions and I answered those to the best of my ability today. Tobacco abuse Talked about the importance of discontinuing tobacco for overall health and wellness (cigar user) I want to see the patient back within the next 9 months, sooner should any new neurologic issues arise.   Subjective:   Thomas Levy was seen today in the movement disorders clinic for neurologic consultation at the request of Ann Held, *.  The consultation is for the evaluation of L hand rest tremor.  Tremor: Yes.     How long has it been going on? 4 years  At rest or with activation?  Rest - pt is R hand dominant - has not really changed over the years  When is it noted the most?  N/a  Fam hx of tremor?  No. (Has a twin/identical and no tremor there)  Located where?  L hand only  Affected by caffeine:  No.  Affected by alcohol:  No.  Affected by stress:  potentially  Affected by fatigue:  No.  Tremor inducing meds:  Yes.  Albuterol (uses rarely)  Other Specific Symptoms:  Voice: it has lessened Sleep: sleeps pretty well  Vivid Dreams:  yes  Acting out dreams:  occasionally, will punch a wall in sleep or scream out (rarely) Wet Pillows:  No. Postural symptoms:  No.  Falls?  No. Bradykinesia symptoms: no bradykinesia noted Loss of smell:  Yes.    Loss of taste:  No. Urinary Incontinence:  No. Difficulty Swallowing:  No. Handwriting, micrographia: No. Trouble with ADL's:  No.  Trouble buttoning clothing: No. Depression:  No. Memory changes:  No. N/V:  No. Lightheaded:  No.  Syncope: No. Diplopia:  No. Dyskinesia:  No.  Neuroimaging of the brain has not previously been performed.    PREVIOUS MEDICATIONS: none to date  ALLERGIES:  No Known Allergies  CURRENT MEDICATIONS:  Current Outpatient Medications  Medication Instructions   albuterol (VENTOLIN HFA) 108 (90 Base) MCG/ACT inhaler 2 puffs, Inhalation, Every 6 hours PRN   allopurinol (ZYLOPRIM) 300 mg, Oral, Daily   atorvastatin (LIPITOR) 20 MG tablet TAKE 1 TABLET BY MOUTH EVERYDAY AT BEDTIME   clotrimazole-betamethasone (LOTRISONE) lotion Topical, 2 times daily, x's 14 days   lisinopril-hydrochlorothiazide (ZESTORETIC) 20-12.5 MG tablet TAKE 2 TABLETS BY MOUTH EVERY DAY   nystatin (MYCOSTATIN/NYSTOP) powder Topical, 4 times daily   sildenafil (REVATIO) 20 MG tablet TAKE 1 TO 5 TABLETS BY MOUTH ONE HOUR PRIOR TO INTERCOURSE AS NEEDED    Objective:   PHYSICAL EXAMINATION:    VITALS:   Vitals:   02/16/21 0834  BP: 116/82  Pulse: 66  SpO2: 97%  Weight: 146 lb (66.2 kg)  Height: 5\' 6"  (1.676 m)  GEN:  The patient appears stated age and is in NAD. HEENT:  Normocephalic, atraumatic.  The mucous membranes are moist. The superficial temporal arteries are without ropiness or tenderness. CV:  RRR Lungs:  CTAB Neck/HEME:  There are no carotid bruits bilaterally.  Neurological examination:  Orientation: The patient is alert and oriented x3.  Cranial nerves: There is good facial symmetry.  There is no facial hypomimia.  Extraocular muscles are intact. The visual fields are full to confrontational testing. The speech is fluent and clear. Soft palate  rises symmetrically and there is no tongue deviation. Hearing is intact to conversational tone. Sensation: Sensation is intact to light touch throughout (facial, trunk, extremities). Vibration is intact at the bilateral big toe. There is no extinction with double simultaneous stimulation.  Motor: Strength is 5/5 in the bilateral upper and lower extremities.   Shoulder shrug is equal and symmetric.  There is no pronator drift. Deep tendon reflexes: Deep tendon reflexes are 3/4 at the bilateral biceps, triceps, brachioradialis, patella and achilles. Plantar responses are downgoing bilaterally.  Movement examination: Tone: There is nl tone in the bilateral upper extremities.  The tone in the lower extremities is nl.  Abnormal movements: there is LUE rest tremor that increases with distraction procedures. Coordination:  There is no decremation with RAM's, with any form of RAMS, including alternating supination and pronation of the forearm, hand opening and closing, finger taps, heel taps and toe taps. Gait and Station: The patient has no difficulty arising out of a deep-seated chair without the use of the hands. The patient's stride length is good with good arm swing.  The patient has a negative pull test.     I have reviewed and interpreted the following labs independently   Chemistry      Component Value Date/Time   NA 138 02/10/2021 0857   K 3.9 02/10/2021 0857   CL 100 02/10/2021 0857   CO2 32 02/10/2021 0857   BUN 31 (H) 02/10/2021 0857   CREATININE 1.13 02/10/2021 0857   CREATININE 1.03 09/10/2020 1400      Component Value Date/Time   CALCIUM 9.4 02/10/2021 0857   ALKPHOS 58 02/10/2021 0857   AST 22 02/10/2021 0857   ALT 15 02/10/2021 0857   BILITOT 0.9 02/10/2021 0857       Lab Results  Component Value Date   TSH 1.73 08/30/2018   Lab Results  Component Value Date   WBC 7.4 09/10/2020   HGB 16.3 09/10/2020   HCT 48.2 09/10/2020   MCV 96.6 09/10/2020   PLT 185 09/10/2020       Total time spent on today's visit was 45 minutes, including both face-to-face time and nonface-to-face time.  Time included that spent on review of records (prior notes available to me/labs/imaging if pertinent), discussing treatment and goals, answering patient's questions and coordinating care.  Cc:  Ann Held, DO

## 2021-02-16 ENCOUNTER — Ambulatory Visit: Payer: Medicare HMO | Admitting: Neurology

## 2021-02-16 ENCOUNTER — Other Ambulatory Visit (INDEPENDENT_AMBULATORY_CARE_PROVIDER_SITE_OTHER): Payer: Medicare HMO

## 2021-02-16 ENCOUNTER — Other Ambulatory Visit: Payer: Self-pay

## 2021-02-16 ENCOUNTER — Encounter: Payer: Self-pay | Admitting: Neurology

## 2021-02-16 VITALS — BP 116/82 | HR 66 | Ht 66.0 in | Wt 146.0 lb

## 2021-02-16 DIAGNOSIS — R251 Tremor, unspecified: Secondary | ICD-10-CM | POA: Diagnosis not present

## 2021-02-16 LAB — TSH: TSH: 1.41 u[IU]/mL (ref 0.35–4.50)

## 2021-02-16 NOTE — Patient Instructions (Signed)
Your provider has requested that you have labwork completed today. The lab is located on the Second floor at [address], within the [facility] office. When you get off the elevator, turn right and go in the [facility] [address]; the first brown door on the left.  Tell the ladies behind the desk that you are there for lab work. If you are not called within 15 minutes please check with the front desk.   Once you complete your labs you are free to go. You will receive a call or message via MyChart with your lab results.    

## 2021-02-20 ENCOUNTER — Other Ambulatory Visit: Payer: Self-pay | Admitting: Family Medicine

## 2021-02-20 DIAGNOSIS — E785 Hyperlipidemia, unspecified: Secondary | ICD-10-CM

## 2021-03-09 DIAGNOSIS — H52213 Irregular astigmatism, bilateral: Secondary | ICD-10-CM | POA: Diagnosis not present

## 2021-03-10 ENCOUNTER — Ambulatory Visit: Payer: Medicare HMO | Admitting: Family Medicine

## 2021-04-30 ENCOUNTER — Other Ambulatory Visit: Payer: Self-pay | Admitting: Family Medicine

## 2021-04-30 DIAGNOSIS — M109 Gout, unspecified: Secondary | ICD-10-CM

## 2021-05-23 ENCOUNTER — Encounter: Payer: Self-pay | Admitting: Family Medicine

## 2021-08-18 ENCOUNTER — Encounter: Payer: Self-pay | Admitting: Neurology

## 2021-08-30 ENCOUNTER — Encounter: Payer: Medicare HMO | Admitting: Family Medicine

## 2021-09-09 ENCOUNTER — Encounter: Payer: Self-pay | Admitting: Family Medicine

## 2021-09-12 ENCOUNTER — Other Ambulatory Visit: Payer: Self-pay | Admitting: Family Medicine

## 2021-09-13 ENCOUNTER — Other Ambulatory Visit: Payer: Self-pay | Admitting: Family Medicine

## 2021-09-13 DIAGNOSIS — F17200 Nicotine dependence, unspecified, uncomplicated: Secondary | ICD-10-CM

## 2021-09-13 DIAGNOSIS — Z801 Family history of malignant neoplasm of trachea, bronchus and lung: Secondary | ICD-10-CM

## 2021-09-26 NOTE — Progress Notes (Signed)
Assessment/Plan:   1.  Parkinsonian tremor  -At this point in time, patient has no other features of Parkinson's disease.  Specifically, he does not have bradykinesia, which is required for the diagnosis.  Certainly, Parkinson's could be coming in his future and this is something we are watching out for, but he currently does not meet criteria for it.  -He asked me about medication for tremor.  I generally do not recommend it, but if tremor is bothersome enough we can certainly trial levodopa.  When he heard that levodopa was a 3 times a day drug, he decided to hold off on that for now.   Subjective:   Thomas Levy was seen today in follow up for parkinsonian tremor.    My previous records were reviewed prior to todays visit as well as outside records available to me.  Last visit, the patient didn't meet criteria for Parkinson's disease.  Since that time, pt denies falls.  He is exercising at the [employer] [facility] 1-2 hours 5 days per week.  Tremor may be a tad worse.  Nothing in the L leg or on the right leg.   Pt denies lightheadedness, near syncope.  No hallucinations.  Mood has been good.  Current prescribed movement disorder medications: None   PREVIOUS MEDICATIONS: none to date  ALLERGIES:  No Known Allergies  CURRENT MEDICATIONS:  Current Meds  Medication Sig   albuterol (VENTOLIN HFA) 108 (90 Base) MCG/ACT inhaler Inhale 2 puffs into the lungs every 6 (six) hours as needed for wheezing or shortness of breath.   allopurinol (ZYLOPRIM) 300 MG tablet Take 1 tablet (300 mg total) by mouth daily.   atorvastatin (LIPITOR) 20 MG tablet TAKE 1 TABLET BY MOUTH EVERYDAY AT BEDTIME   clotrimazole-betamethasone (LOTRISONE) lotion Apply topically 2 (two) times daily. x's 14 days   lisinopril-hydrochlorothiazide (ZESTORETIC) 20-12.5 MG tablet TAKE 2 TABLETS BY MOUTH EVERY DAY   nystatin (MYCOSTATIN/NYSTOP) powder Apply topically 4 (four) times daily.   sildenafil (REVATIO) 20 MG tablet  Use as directed     Objective:   PHYSICAL EXAMINATION:    VITALS:   Vitals:   09/27/21 1254  BP: 122/82  Pulse: 68  SpO2: 99%  Weight: 149 lb 9.6 oz (67.9 kg)  Height: 5\' 2"  (1.575 m)    GEN:  The patient appears stated age and is in NAD. HEENT:  Normocephalic, atraumatic.  The mucous membranes are moist. The superficial temporal arteries are without ropiness or tenderness. CV:  RRR Lungs:  CTAB Neck/HEME:  There are no carotid bruits bilaterally.  Neurological examination:  Orientation: The patient is alert and oriented x3. Cranial nerves: There is good facial symmetry without facial hypomimia. The speech is fluent and clear. Soft palate rises symmetrically and there is no tongue deviation. Hearing is intact to conversational tone. Sensation: Sensation is intact to light touch throughout Motor: Strength is at least antigravity x4.  Movement examination: Tone: There is normal tone in the upper and lower extremities. Abnormal movements:there is LUE rest tremor that increases with distraction Coordination:  There is no decremation with RAM's, with any form of RAMS, including alternating supination and pronation of the forearm, hand opening and closing, finger taps, heel taps and toe taps. Gait and Station: The patient has no difficulty arising out of a deep-seated chair without the use of the hands. The patient's stride length is good.  He is also able to run down the hall without any difficulty.   I have  reviewed and interpreted the following labs independently    Chemistry      Component Value Date/Time   NA 138 02/10/2021 0857   K 3.9 02/10/2021 0857   CL 100 02/10/2021 0857   CO2 32 02/10/2021 0857   BUN 31 (H) 02/10/2021 0857   CREATININE 1.13 02/10/2021 0857   CREATININE 1.03 09/10/2020 1400      Component Value Date/Time   CALCIUM 9.4 02/10/2021 0857   ALKPHOS 58 02/10/2021 0857   AST 22 02/10/2021 0857   ALT 15 02/10/2021 0857   BILITOT 0.9 02/10/2021 0857        Lab Results  Component Value Date   WBC 7.4 09/10/2020   HGB 16.3 09/10/2020   HCT 48.2 09/10/2020   MCV 96.6 09/10/2020   PLT 185 09/10/2020    Lab Results  Component Value Date   TSH 1.41 02/16/2021     Cc:  Ann Held, DO

## 2021-09-27 ENCOUNTER — Encounter: Payer: Self-pay | Admitting: Neurology

## 2021-09-27 ENCOUNTER — Encounter: Payer: Self-pay | Admitting: Family Medicine

## 2021-09-27 ENCOUNTER — Other Ambulatory Visit: Payer: Self-pay

## 2021-09-27 ENCOUNTER — Ambulatory Visit (INDEPENDENT_AMBULATORY_CARE_PROVIDER_SITE_OTHER): Payer: Medicare HMO | Admitting: Family Medicine

## 2021-09-27 ENCOUNTER — Ambulatory Visit: Payer: Medicare HMO | Admitting: Neurology

## 2021-09-27 VITALS — BP 122/82 | HR 68 | Ht 62.0 in | Wt 149.6 lb

## 2021-09-27 VITALS — BP 118/80 | HR 60 | Temp 97.8°F | Resp 18 | Ht 66.0 in | Wt 149.0 lb

## 2021-09-27 DIAGNOSIS — I10 Essential (primary) hypertension: Secondary | ICD-10-CM | POA: Diagnosis not present

## 2021-09-27 DIAGNOSIS — R251 Tremor, unspecified: Secondary | ICD-10-CM

## 2021-09-27 DIAGNOSIS — Z0000 Encounter for general adult medical examination without abnormal findings: Secondary | ICD-10-CM | POA: Diagnosis not present

## 2021-09-27 DIAGNOSIS — M109 Gout, unspecified: Secondary | ICD-10-CM | POA: Diagnosis not present

## 2021-09-27 DIAGNOSIS — E785 Hyperlipidemia, unspecified: Secondary | ICD-10-CM | POA: Diagnosis not present

## 2021-09-27 DIAGNOSIS — J9801 Acute bronchospasm: Secondary | ICD-10-CM

## 2021-09-27 DIAGNOSIS — N529 Male erectile dysfunction, unspecified: Secondary | ICD-10-CM | POA: Diagnosis not present

## 2021-09-27 DIAGNOSIS — Z87891 Personal history of nicotine dependence: Secondary | ICD-10-CM | POA: Diagnosis not present

## 2021-09-27 LAB — LIPID PANEL
Cholesterol: 174 mg/dL (ref 0–200)
HDL: 71.3 mg/dL (ref >39.00)
LDL Cholesterol: 87 mg/dL (ref 0–99)
NonHDL: 102.72
Total CHOL/HDL Ratio: 2
Triglycerides: 79 mg/dL (ref 0.0–149.0)
VLDL: 15.8 mg/dL (ref 0.0–40.0)

## 2021-09-27 LAB — COMPREHENSIVE METABOLIC PANEL
ALT: 19 U/L (ref 0–53)
AST: 27 U/L (ref 0–37)
Albumin: 4.5 g/dL (ref 3.5–5.2)
Alkaline Phosphatase: 62 U/L (ref 39–117)
BUN: 17 mg/dL (ref 6–23)
CO2: 33 mEq/L — ABNORMAL HIGH (ref 19–32)
Calcium: 9.7 mg/dL (ref 8.4–10.5)
Chloride: 97 mEq/L (ref 96–112)
Creatinine, Ser: 0.99 mg/dL (ref 0.40–1.50)
GFR: 79.36 mL/min (ref >60.00)
Glucose, Bld: 94 mg/dL (ref 70–99)
Potassium: 4.1 mEq/L (ref 3.5–5.1)
Sodium: 137 mEq/L (ref 135–145)
Total Bilirubin: 1.1 mg/dL (ref 0.2–1.2)
Total Protein: 7.2 g/dL (ref 6.0–8.3)

## 2021-09-27 LAB — PSA: PSA: 1 ng/mL (ref 0.10–4.00)

## 2021-09-27 LAB — CBC WITH DIFFERENTIAL/PLATELET
Basophils Absolute: 0 10*3/uL (ref 0.0–0.1)
Basophils Relative: 0.8 % (ref 0.0–3.0)
Eosinophils Absolute: 0.4 10*3/uL (ref 0.0–0.7)
Eosinophils Relative: 6 % — ABNORMAL HIGH (ref 0.0–5.0)
HCT: 44.4 % (ref 39.0–52.0)
Hemoglobin: 15.1 g/dL (ref 13.0–17.0)
Lymphocytes Relative: 48 % — ABNORMAL HIGH (ref 12.0–46.0)
Lymphs Abs: 2.9 10*3/uL (ref 0.7–4.0)
MCHC: 34 g/dL (ref 30.0–36.0)
MCV: 96.7 fl (ref 78.0–100.0)
Monocytes Absolute: 0.4 10*3/uL (ref 0.1–1.0)
Monocytes Relative: 6.9 % (ref 3.0–12.0)
Neutro Abs: 2.3 10*3/uL (ref 1.4–7.7)
Neutrophils Relative %: 38.3 % — ABNORMAL LOW (ref 43.0–77.0)
Platelets: 175 10*3/uL (ref 150.0–400.0)
RBC: 4.59 Mil/uL (ref 4.22–5.81)
RDW: 13.2 % (ref 11.5–15.5)
WBC: 6.1 10*3/uL (ref 4.0–10.5)

## 2021-09-27 LAB — URIC ACID: Uric Acid, Serum: 4.3 mg/dL (ref 4.0–7.8)

## 2021-09-27 MED ORDER — ALBUTEROL SULFATE HFA 108 (90 BASE) MCG/ACT IN AERS: 2.0000 | INHALATION_SPRAY | Freq: Four times a day (QID) | RESPIRATORY_TRACT | 5 refills | Status: DC | PRN

## 2021-09-27 MED ORDER — ALLOPURINOL 300 MG PO TABS: 300.0000 mg | ORAL_TABLET | Freq: Every day | ORAL | 1 refills | Status: DC

## 2021-09-27 MED ORDER — SILDENAFIL CITRATE 20 MG PO TABS: ORAL_TABLET | ORAL | 0 refills | Status: DC

## 2021-09-27 NOTE — Patient Instructions (Signed)
Preventive Care 65 Years and Older, Male °Preventive care refers to lifestyle choices and visits with your health care provider that can promote health and wellness. Preventive care visits are also called wellness exams. °What can I expect for my preventive care visit? °Counseling °During your preventive care visit, your health care provider may ask about your: °Medical history, including: °Past medical problems. °Family medical history. °History of falls. °Current health, including: °Emotional well-being. °Home life and relationship well-being. °Sexual activity. °Memory and ability to understand (cognition). °Lifestyle, including: °Alcohol, nicotine or tobacco, and drug use. °Access to firearms. °Diet, exercise, and sleep habits. °Work and work environment. °Sunscreen use. °Safety issues such as seatbelt and bike helmet use. °Physical exam °Your health care provider will check your: °Height and weight. These may be used to calculate your BMI (body mass index). BMI is a measurement that tells if you are at a healthy weight. °Waist circumference. This measures the distance around your waistline. This measurement also tells if you are at a healthy weight and may help predict your risk of certain diseases, such as type 2 diabetes and high blood pressure. °Heart rate and blood pressure. °Body temperature. °Skin for abnormal spots. °What immunizations do I need? °Vaccines are usually given at various ages, according to a schedule. Your health care provider will recommend vaccines for you based on your age, medical history, and lifestyle or other factors, such as travel or where you work. °What tests do I need? °Screening °Your health care provider may recommend screening tests for certain conditions. This may include: °Lipid and cholesterol levels. °Diabetes screening. This is done by checking your blood sugar (glucose) after you have not eaten for a while (fasting). °Hepatitis C test. °Hepatitis B test. °HIV (human  immunodeficiency virus) test. °STI (sexually transmitted infection) testing, if you are at risk. °Lung cancer screening. °Colorectal cancer screening. °Prostate cancer screening. °Abdominal aortic aneurysm (AAA) screening. You may need this if you are a current or former smoker. °Talk with your health care provider about your test results, treatment options, and if necessary, the need for more tests. °Follow these instructions at home: °Eating and drinking ° °Eat a diet that includes fresh fruits and vegetables, whole grains, lean protein, and low-fat dairy products. Limit your intake of foods with high amounts of sugar, saturated fats, and salt. °Take vitamin and mineral supplements as recommended by your health care provider. °Do not drink alcohol if your health care provider tells you not to drink. °If you drink alcohol: °Limit how much you have to 0-2 drinks a day. °Know how much alcohol is in your drink. In the [place] one drink equals one 12 oz bottle of beer (355 mL), one 5 oz glass of wine (148 mL), or one 1½ oz glass of hard liquor (44 mL). °Lifestyle °Brush your teeth every morning and night with fluoride toothpaste. Floss one time each day. °Exercise for at least 30 minutes 5 or more days each week. °Do not use any products that contain nicotine or tobacco. These products include cigarettes, chewing tobacco, and vaping devices, such as e-cigarettes. If you need help quitting, ask your health care provider. °Do not use drugs. °If you are sexually active, practice safe sex. Use a condom or other form of protection to prevent STIs. °Take aspirin only as told by your health care provider. Make sure that you understand how much to take and what form to take. Work with your health care provider to find out whether it is safe and   beneficial for you to take aspirin daily. °Ask your health care provider if you need to take a cholesterol-lowering medicine (statin). °Find healthy ways to manage stress, such  as: °Meditation, yoga, or listening to music. °Journaling. °Talking to a trusted person. °Spending time with friends and family. °Safety °Always wear your seat belt while driving or riding in a vehicle. °Do not drive: °If you have been drinking alcohol. Do not ride with someone who has been drinking. °When you are tired or distracted. °While texting. °If you have been using any mind-altering substances or drugs. °Wear a helmet and other protective equipment during sports activities. °If you have firearms in your house, make sure you follow all gun safety procedures. °Minimize exposure to UV radiation to reduce your risk of skin cancer. °What's next? °Visit your health care provider once a year for an annual wellness visit. °Ask your health care provider how often you should have your eyes and teeth checked. °Stay up to date on all vaccines. °This information is not intended to replace advice given to you by your health care provider. Make sure you discuss any questions you have with your health care provider. °Document Revised: 02/09/2021 Document Reviewed: 02/09/2021 °Elsevier Patient Education © 2022 [employer] ° °

## 2021-09-27 NOTE — Progress Notes (Signed)
Established Patient Office Visit  Subjective:  Patient ID: Thomas Levy, male    DOB: 09-21-1955  Age: 65 y.o. MRN: [id number]  CC:  Chief Complaint  Patient presents with   Annual Exam    Pt states fasting     HPI Thomas Levy presents for cpe and to f/u bp and hyperlipidemia --- no complaints  Pt brother was dx with stage 1 lung cancer ---  pt has a hx of cigarette use -- quit 5 years ago and still smoke cigars occasionally --- concerned about lung cancer and has had a cough--- non productive   Past Medical History:  Diagnosis Date   Hyperlipidemia    Hypertension     Past Surgical History:  Procedure Laterality Date   HERNIA REPAIR     VASECTOMY      Family History  Problem Relation Age of Onset   Hypertension Mother    Cancer Mother        breast   Kidney disease Father        kidney disease-- on dialysis   Hypertension Brother    Hyperlipidemia Brother    Thyroid cancer Child     Social History   Socioeconomic History   Marital status: Married    Spouse name: Not on file   Number of children: Not on file   Years of education: Not on file   Highest education level: Not on file  Occupational History   Not on file  Tobacco Use   Smoking status: Some Days    Types: Cigars    Start date: 08/29/2008   Smokeless tobacco: Never   Tobacco comments:    Quit smoking cigaretts 5 years ago    Smoked 1 cig a day x 30 years   Substance and Sexual Activity   Alcohol use: Yes    Alcohol/week: 7.0 standard drinks    Types: 7 Glasses of wine per week   Drug use: No   Sexual activity: Yes    Partners: Female  Other Topics Concern   Not on file  Social History Narrative   Gym-- 4 days a week for 1 hour      Right Handed   Lives in a one story home    Drinks no Caffeine    Social Determinants of Health   Financial Resource Strain: Not on file  Food Insecurity: Not on file  Transportation Needs: Not on file  Physical Activity: Not on file  Stress: Not  on file  Social Connections: Not on file  Intimate Partner Violence: Not on file    Outpatient Medications Prior to Visit  Medication Sig Dispense Refill   atorvastatin (LIPITOR) 20 MG tablet TAKE 1 TABLET BY MOUTH EVERYDAY AT BEDTIME 90 tablet 1   clotrimazole-betamethasone (LOTRISONE) lotion Apply topically 2 (two) times daily. x's 14 days 30 mL 0   lisinopril-hydrochlorothiazide (ZESTORETIC) 20-12.5 MG tablet TAKE 2 TABLETS BY MOUTH EVERY DAY 180 tablet 1   nystatin (MYCOSTATIN/NYSTOP) powder Apply topically 4 (four) times daily. 15 g 0   albuterol (VENTOLIN HFA) 108 (90 Base) MCG/ACT inhaler Inhale 2 puffs into the lungs every 6 (six) hours as needed for wheezing or shortness of breath. 18 g 5   allopurinol (ZYLOPRIM) 300 MG tablet TAKE 1 TABLET BY MOUTH EVERY DAY 90 tablet 1   sildenafil (REVATIO) 20 MG tablet TAKE 1 TO 5 TABLETS BY MOUTH ONE HOUR PRIOR TO INTERCOURSE AS NEEDED 100 tablet 0   No facility-administered medications  prior to visit.    No Known Allergies  ROS Review of Systems  Constitutional: Negative.  Negative for fever.  HENT:  Negative for congestion, ear pain, hearing loss, nosebleeds, postnasal drip, rhinorrhea, sinus pressure, sneezing and tinnitus.   Eyes:  Negative for photophobia, discharge, itching and visual disturbance.  Respiratory: Negative.  Negative for shortness of breath.   Cardiovascular: Negative.  Negative for chest pain, palpitations and leg swelling.  Gastrointestinal:  Negative for abdominal distention, abdominal pain, anal bleeding, blood in stool, constipation and nausea.  Endocrine: Negative.   Genitourinary: Negative.  Negative for dysuria and frequency.  Musculoskeletal: Negative.   Skin: Negative.  Negative for rash.  Allergic/Immunologic: Negative.  Negative for environmental allergies.  Neurological:  Negative for dizziness, weakness, light-headedness, numbness and headaches.  Psychiatric/Behavioral:  Negative for agitation,  confusion, decreased concentration, dysphoric mood, sleep disturbance and suicidal ideas. The patient is not nervous/anxious.      Objective:    Physical Exam Vitals and nursing note reviewed.  Constitutional:      Appearance: He is well-developed.  HENT:     Head: Normocephalic and atraumatic.  Eyes:     Pupils: Pupils are equal, round, and reactive to light.  Neck:     Thyroid: No thyromegaly.  Cardiovascular:     Rate and Rhythm: Normal rate and regular rhythm.     Heart sounds: No murmur heard. Pulmonary:     Effort: Pulmonary effort is normal. No respiratory distress.     Breath sounds: Normal breath sounds. No wheezing or rales.  Chest:     Chest wall: No tenderness.  Musculoskeletal:        General: No tenderness.     Cervical back: Normal range of motion and neck supple.  Skin:    General: Skin is warm and dry.  Neurological:     Mental Status: He is alert and oriented to person, place, and time.  Psychiatric:        Behavior: Behavior normal.        Thought Content: Thought content normal.        Judgment: Judgment normal.    BP 118/80 (BP Location: Left Arm, Patient Position: Sitting, Cuff Size: Normal)    Pulse 60    Temp 97.8 F (36.6 C) (Oral)    Resp 18    Ht 5\' 6"  (1.676 m)    Wt 149 lb (67.6 kg)    SpO2 96%    BMI 24.05 kg/m  Wt Readings from Last 3 Encounters:  09/27/21 149 lb (67.6 kg)  02/16/21 146 lb (66.2 kg)  02/10/21 146 lb 9.6 oz (66.5 kg)     There are no preventive care reminders to display for this patient.  There are no preventive care reminders to display for this patient.  Lab Results  Component Value Date   TSH 1.41 02/16/2021   Lab Results  Component Value Date   WBC 7.4 09/10/2020   HGB 16.3 09/10/2020   HCT 48.2 09/10/2020   MCV 96.6 09/10/2020   PLT 185 09/10/2020   Lab Results  Component Value Date   NA 138 02/10/2021   K 3.9 02/10/2021   CO2 32 02/10/2021   GLUCOSE 90 02/10/2021   BUN 31 (H) 02/10/2021    CREATININE 1.13 02/10/2021   BILITOT 0.9 02/10/2021   ALKPHOS 58 02/10/2021   AST 22 02/10/2021   ALT 15 02/10/2021   PROT 6.8 02/10/2021   ALBUMIN 4.4 02/10/2021   CALCIUM 9.4 02/10/2021  GFR 68.01 02/10/2021   Lab Results  Component Value Date   CHOL 170 02/10/2021   Lab Results  Component Value Date   HDL 64.20 02/10/2021   Lab Results  Component Value Date   LDLCALC 89 02/10/2021   Lab Results  Component Value Date   TRIG 80.0 02/10/2021   Lab Results  Component Value Date   CHOLHDL 3 02/10/2021   No results found for: HGBA1C    Assessment & Plan:   Problem List Items Addressed This Visit       Unprioritized   Erectile dysfunction   Relevant Medications   sildenafil (REVATIO) 20 MG tablet   Essential hypertension    Well controlled, no changes to meds. Encouraged heart healthy diet such as the DASH diet and exercise as tolerated.       Relevant Medications   sildenafil (REVATIO) 20 MG tablet   Gout    Check uric acid con't allopurinol       Relevant Medications   allopurinol (ZYLOPRIM) 300 MG tablet   Other Relevant Orders   Uric acid   Hyperlipidemia LDL goal <100    Encourage heart healthy diet such as MIND or DASH diet, increase exercise, avoid trans fats, simple carbohydrates and processed foods, consider a krill or fish or flaxseed oil cap daily.       Relevant Medications   sildenafil (REVATIO) 20 MG tablet   Preventative health care - Primary    ghm utd Check labs       Other Visit Diagnoses     Bronchospasm       Relevant Medications   albuterol (VENTOLIN HFA) 108 (90 Base) MCG/ACT inhaler   Primary hypertension       Relevant Medications   sildenafil (REVATIO) 20 MG tablet   Other Relevant Orders   CBC with Differential/Platelet   Comprehensive metabolic panel   Lipid panel   PSA   Hyperlipidemia, unspecified hyperlipidemia type       Relevant Medications   sildenafil (REVATIO) 20 MG tablet   Other Relevant Orders    CBC with Differential/Platelet   Comprehensive metabolic panel   Lipid panel   PSA   Former smoker       Relevant Orders   CT CHEST LUNG CA SCREEN LOW DOSE W/O CM       Meds ordered this encounter  Medications   allopurinol (ZYLOPRIM) 300 MG tablet    Sig: Take 1 tablet (300 mg total) by mouth daily.    Dispense:  90 tablet    Refill:  1   albuterol (VENTOLIN HFA) 108 (90 Base) MCG/ACT inhaler    Sig: Inhale 2 puffs into the lungs every 6 (six) hours as needed for wheezing or shortness of breath.    Dispense:  18 g    Refill:  5   sildenafil (REVATIO) 20 MG tablet    Sig: Use as directed    Dispense:  100 tablet    Refill:  0    Follow-up: Return in about 6 months (around 03/27/2022), or if symptoms worsen or fail to improve, for hypertension, hyperlipidemia.    Ann Held, DO

## 2021-09-27 NOTE — Patient Instructions (Addendum)
No change for now.  Let me know if this gets worse.    The physicians and staff at [facility] are committed to providing excellent care. You may receive a survey requesting feedback about your experience at our office. We strive to receive "very good" responses to the survey questions. If you feel that your experience would prevent you from giving the office a "very good " response, please contact our office to try to remedy the situation. We may be reached at [phone]. Thank you for taking the time out of your busy day to complete the survey.

## 2021-09-27 NOTE — Assessment & Plan Note (Signed)
Well controlled, no changes to meds. Encouraged heart healthy diet such as the DASH diet and exercise as tolerated.  °

## 2021-09-27 NOTE — Assessment & Plan Note (Signed)
Check uric acid con't allopurinol

## 2021-09-27 NOTE — Assessment & Plan Note (Signed)
ghm utd Check labs  

## 2021-09-27 NOTE — Assessment & Plan Note (Signed)
Encourage heart healthy diet such as MIND or DASH diet, increase exercise, avoid trans fats, simple carbohydrates and processed foods, consider a krill or fish or flaxseed oil cap daily.  °

## 2021-09-29 ENCOUNTER — Other Ambulatory Visit: Payer: Self-pay

## 2021-09-29 ENCOUNTER — Ambulatory Visit (HOSPITAL_BASED_OUTPATIENT_CLINIC_OR_DEPARTMENT_OTHER)
Admission: RE | Admit: 2021-09-29 | Discharge: 2021-09-29 | Disposition: A | Discharge status: Home / Self Care | Payer: Medicare HMO | Source: Ambulatory Visit | Attending: Family Medicine | Admitting: Family Medicine

## 2021-09-29 DIAGNOSIS — Z87891 Personal history of nicotine dependence: Secondary | ICD-10-CM | POA: Diagnosis not present

## 2021-10-02 ENCOUNTER — Encounter: Payer: Self-pay | Admitting: Family Medicine

## 2021-10-02 ENCOUNTER — Other Ambulatory Visit: Payer: Self-pay | Admitting: Family Medicine

## 2021-10-02 DIAGNOSIS — R918 Other nonspecific abnormal finding of lung field: Secondary | ICD-10-CM

## 2021-10-11 ENCOUNTER — Encounter: Payer: Self-pay | Admitting: Neurology

## 2021-10-11 ENCOUNTER — Encounter: Payer: Self-pay | Admitting: Family Medicine

## 2021-10-11 MED ORDER — CARBIDOPA-LEVODOPA 25-100 MG PO TABS: 1.0000 | ORAL_TABLET | Freq: Three times a day (TID) | ORAL | 1 refills | Status: DC

## 2021-10-19 ENCOUNTER — Other Ambulatory Visit: Payer: Self-pay | Admitting: Family Medicine

## 2021-10-19 DIAGNOSIS — E785 Hyperlipidemia, unspecified: Secondary | ICD-10-CM

## 2021-10-21 ENCOUNTER — Ambulatory Visit: Payer: Medicare HMO | Admitting: Pulmonary Disease

## 2021-10-21 ENCOUNTER — Other Ambulatory Visit: Payer: Self-pay

## 2021-10-21 ENCOUNTER — Encounter: Payer: Self-pay | Admitting: Pulmonary Disease

## 2021-10-21 VITALS — BP 128/86 | HR 94 | Temp 97.9°F | Ht 66.0 in | Wt 146.0 lb

## 2021-10-21 DIAGNOSIS — R911 Solitary pulmonary nodule: Secondary | ICD-10-CM

## 2021-10-21 DIAGNOSIS — Z87891 Personal history of nicotine dependence: Secondary | ICD-10-CM

## 2021-10-21 DIAGNOSIS — R918 Other nonspecific abnormal finding of lung field: Secondary | ICD-10-CM

## 2021-10-21 NOTE — Progress Notes (Signed)
Synopsis: Referred in February 2023 for abnormal lung cancer screening CT by Carollee Herter, Alferd Apa, *  Subjective:   PATIENT ID: Thomas Levy GENDER: male DOB: 07-21-1956, MRN: [id number]  Chief Complaint  Patient presents with   Consult    Patient is here to talk about pulmonary nodules.     This is a 66 year old gentleman, past medical history of hypertension, hyperlipidemia.  Patient is a retired Pharmacist, community from town.  He has a longstanding history of tobacco use.  He quit a couple of years ago.  Smoked on and off and occasionally had cigar use.  Had a lung cancer screening CT completed by primary care which showed a 15 mm right lower lobe pulmonary nodule.  Also has some associated bronchial thickening.  Some mild areas of upper lobe emphysematous changes.  We reviewed his CT images today in the office.  Of note, his father is a former Acupuncturist at [employer].  And he grew up in [place] and went to high school there.  He then went to [employer] for an engineering degree and ultimately went to dental school at [employer] with his twin brother.  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   Past Medical History:  Diagnosis Date   Hyperlipidemia    Hypertension      Family History  Problem Relation Age of Onset   Hypertension Mother    Cancer Mother        breast   Kidney disease Father        kidney disease-- on dialysis   Hypertension Brother    Hyperlipidemia Brother    Thyroid cancer Child      Past Surgical History:  Procedure Laterality Date   HERNIA REPAIR     VASECTOMY      Social History   Socioeconomic History   Marital status: Married    Spouse name: Not on file   Number of children: Not on file   Years of education: Not on file   Highest education level: Not on file  Occupational History   Not on file  Tobacco Use   Smoking status: Former    Types: Cigars    Start date: 08/29/2008   Smokeless tobacco: Never    Tobacco comments:    Quit smoking cigaretts 5 years ago    Smoked 1 cig a day x 30 years   Substance and Sexual Activity   Alcohol use: Yes    Alcohol/week: 7.0 standard drinks    Types: 7 Glasses of wine per week   Drug use: No   Sexual activity: Yes    Partners: Female  Other Topics Concern   Not on file  Social History Narrative   Gym-- 4 days a week for 1 hour      Right Handed   Lives in a one story home    Drinks no Caffeine    Social Determinants of Health   Financial Resource Strain: Not on file  Food Insecurity: Not on file  Transportation Needs: Not on file  Physical Activity: Not on file  Stress: Not on file  Social Connections: Not on file  Intimate Partner Violence: Not on file     No Known Allergies   Outpatient Medications Prior to Visit  Medication Sig Dispense Refill   albuterol (VENTOLIN HFA) 108 (90 Base) MCG/ACT inhaler Inhale 2 puffs into the lungs every 6 (six) hours as needed for wheezing or shortness of  breath. 18 g 5   allopurinol (ZYLOPRIM) 300 MG tablet Take 1 tablet (300 mg total) by mouth daily. 90 tablet 1   atorvastatin (LIPITOR) 20 MG tablet TAKE 1 TABLET BY MOUTH EVERYDAY AT BEDTIME 90 tablet 1   carbidopa-levodopa (SINEMET IR) 25-100 MG tablet Take 1 tablet by mouth 3 (three) times daily. 7am/11am/4pm 270 tablet 1   clotrimazole-betamethasone (LOTRISONE) lotion Apply topically 2 (two) times daily. x's 14 days 30 mL 0   lisinopril-hydrochlorothiazide (ZESTORETIC) 20-12.5 MG tablet TAKE 2 TABLETS BY MOUTH EVERY DAY 180 tablet 1   nystatin (MYCOSTATIN/NYSTOP) powder Apply topically 4 (four) times daily. 15 g 0   sildenafil (REVATIO) 20 MG tablet Use as directed 100 tablet 0   No facility-administered medications prior to visit.    Review of Systems  Constitutional:  Negative for chills, fever, malaise/fatigue and weight loss.  HENT:  Negative for hearing loss, sore throat and tinnitus.   Eyes:  Negative for blurred vision and double  vision.  Respiratory:  Negative for cough, hemoptysis, sputum production, shortness of breath, wheezing and stridor.   Cardiovascular:  Negative for chest pain, palpitations, orthopnea, leg swelling and PND.  Gastrointestinal:  Negative for abdominal pain, constipation, diarrhea, heartburn, nausea and vomiting.  Genitourinary:  Negative for dysuria, hematuria and urgency.  Musculoskeletal:  Negative for joint pain and myalgias.  Skin:  Negative for itching and rash.  Neurological:  Negative for dizziness, tingling, weakness and headaches.  Endo/Heme/Allergies:  Negative for environmental allergies. Does not bruise/bleed easily.  Psychiatric/Behavioral:  Negative for depression. The patient is not nervous/anxious and does not have insomnia.   All other systems reviewed and are negative.   Objective:  Physical Exam Vitals reviewed.  Constitutional:      General: He is not in acute distress.    Appearance: He is well-developed.  HENT:     Head: Normocephalic and atraumatic.  Eyes:     General: No scleral icterus.    Conjunctiva/sclera: Conjunctivae normal.     Pupils: Pupils are equal, round, and reactive to light.  Neck:     Vascular: No JVD.     Trachea: No tracheal deviation.  Cardiovascular:     Rate and Rhythm: Normal rate and regular rhythm.     Heart sounds: Normal heart sounds. No murmur heard. Pulmonary:     Effort: Pulmonary effort is normal. No tachypnea, accessory muscle usage or respiratory distress.     Breath sounds: No stridor. No wheezing, rhonchi or rales.  Abdominal:     General: There is no distension.     Palpations: Abdomen is soft.     Tenderness: There is no abdominal tenderness.  Musculoskeletal:        General: No tenderness.     Cervical back: Neck supple.  Lymphadenopathy:     Cervical: No cervical adenopathy.  Skin:    General: Skin is warm and dry.     Capillary Refill: Capillary refill takes less than 2 seconds.     Findings: No rash.   Neurological:     Mental Status: He is alert and oriented to person, place, and time.  Psychiatric:        Behavior: Behavior normal.     Vitals:   10/21/21 1032  BP: 128/86  Pulse: 94  Temp: 97.9 F (36.6 C)  TempSrc: Oral  SpO2: 97%  Weight: 146 lb (66.2 kg)  Height: 5\' 6"  (1.676 m)   97% on RA BMI Readings from Last 3 Encounters:  10/21/21  23.57 kg/m  09/27/21 27.36 kg/m  09/27/21 24.05 kg/m   Wt Readings from Last 3 Encounters:  10/21/21 146 lb (66.2 kg)  09/27/21 149 lb 9.6 oz (67.9 kg)  09/27/21 149 lb (67.6 kg)     CBC    Component Value Date/Time   WBC 6.1 09/27/2021 1121   RBC 4.59 09/27/2021 1121   HGB 15.1 09/27/2021 1121   HCT 44.4 09/27/2021 1121   PLT 175.0 09/27/2021 1121   MCV 96.7 09/27/2021 1121   MCH 32.7 09/10/2020 1400   MCHC 34.0 09/27/2021 1121   RDW 13.2 09/27/2021 1121   LYMPHSABS 2.9 09/27/2021 1121   MONOABS 0.4 09/27/2021 1121   EOSABS 0.4 09/27/2021 1121   BASOSABS 0.0 09/27/2021 1121     Chest Imaging: CT chest lung cancer screening: Right lower lobe 15 mm pulmonary nodule. The patient's images have been independently reviewed by me.    Pulmonary Functions Testing Results: No flowsheet data found.  FeNO:   Pathology:   Echocardiogram:   Heart Catheterization:     Assessment & Plan:     ICD-10-CM   1. Lung nodule  R91.1 NM PET Image Initial (PI) Skull Base To Thigh (F-18 FDG)    2. Former smoker  Z87.891     3. Abnormal CT lung screening  R91.8       Discussion:  This is a 66 year old gentleman, abnormal CT screening of the lung with a 15 mm right lower lobe pulmonary nodule.  He is a former smoker.  Plan: I think next step for evaluation of the nodule would include a nuclear medicine pet image. Pending upon the results of the nuclear medicine pet image we can consider next steps. We discussed the role of bronchoscopy and tissue biopsy in this setting. I think based upon the PET scan results we may  need to discuss other options.  If the PET scan is concerning for malignancy then we may need to consider even removal with thoracic surgery. I think we will have a clear understanding of what we need to do with the nodule after the pet imaging. We talked about this in detail with the patient.  Patient is agreeable to this plan. Return to clinic in approximately 2 to 3 weeks after the PET imaging is complete.   Current Outpatient Medications:    albuterol (VENTOLIN HFA) 108 (90 Base) MCG/ACT inhaler, Inhale 2 puffs into the lungs every 6 (six) hours as needed for wheezing or shortness of breath., Disp: 18 g, Rfl: 5   allopurinol (ZYLOPRIM) 300 MG tablet, Take 1 tablet (300 mg total) by mouth daily., Disp: 90 tablet, Rfl: 1   atorvastatin (LIPITOR) 20 MG tablet, TAKE 1 TABLET BY MOUTH EVERYDAY AT BEDTIME, Disp: 90 tablet, Rfl: 1   carbidopa-levodopa (SINEMET IR) 25-100 MG tablet, Take 1 tablet by mouth 3 (three) times daily. 7am/11am/4pm, Disp: 270 tablet, Rfl: 1   clotrimazole-betamethasone (LOTRISONE) lotion, Apply topically 2 (two) times daily. x's 14 days, Disp: 30 mL, Rfl: 0   lisinopril-hydrochlorothiazide (ZESTORETIC) 20-12.5 MG tablet, TAKE 2 TABLETS BY MOUTH EVERY DAY, Disp: 180 tablet, Rfl: 1   nystatin (MYCOSTATIN/NYSTOP) powder, Apply topically 4 (four) times daily., Disp: 15 g, Rfl: 0   sildenafil (REVATIO) 20 MG tablet, Use as directed, Disp: 100 tablet, Rfl: 0    Garner Nash, DO [facility] 10/21/2021 11:11 AM

## 2021-10-21 NOTE — Patient Instructions (Signed)
Thank you for visiting Dr. Valeta Harms at [facility]. Today we recommend the following:  Orders Placed This Encounter  Procedures   NM PET Image Initial (PI) Skull Base To Thigh (F-18 FDG)   See [place] after PET imaging complete.   Return in about 3 weeks (around 11/11/2021) for w/ Dr. Valeta Harms .    Please do your part to reduce the spread of COVID-19.

## 2021-11-01 DIAGNOSIS — Z0100 Encounter for examination of eyes and vision without abnormal findings: Secondary | ICD-10-CM | POA: Diagnosis not present

## 2021-11-07 ENCOUNTER — Ambulatory Visit (HOSPITAL_COMMUNITY)
Admission: RE | Admit: 2021-11-07 | Discharge: 2021-11-07 | Disposition: A | Discharge status: Home / Self Care | Payer: Medicare HMO | Source: Ambulatory Visit | Attending: Pulmonary Disease | Admitting: Pulmonary Disease

## 2021-11-07 ENCOUNTER — Other Ambulatory Visit: Payer: Self-pay | Admitting: Family Medicine

## 2021-11-07 ENCOUNTER — Encounter: Payer: Self-pay | Admitting: Family Medicine

## 2021-11-07 ENCOUNTER — Other Ambulatory Visit: Payer: Self-pay

## 2021-11-07 DIAGNOSIS — F419 Anxiety disorder, unspecified: Secondary | ICD-10-CM

## 2021-11-07 DIAGNOSIS — R911 Solitary pulmonary nodule: Secondary | ICD-10-CM | POA: Diagnosis not present

## 2021-11-07 DIAGNOSIS — R918 Other nonspecific abnormal finding of lung field: Secondary | ICD-10-CM | POA: Diagnosis not present

## 2021-11-07 DIAGNOSIS — I700 Atherosclerosis of aorta: Secondary | ICD-10-CM | POA: Diagnosis not present

## 2021-11-07 DIAGNOSIS — K5730 Diverticulosis of large intestine without perforation or abscess without bleeding: Secondary | ICD-10-CM | POA: Insufficient documentation

## 2021-11-07 LAB — GLUCOSE, CAPILLARY
Glucose-Capillary: 97 mg/dL (ref 70–99)
Glucose-Capillary: 113 mg/dL — ABNORMAL HIGH (ref 70–99)

## 2021-11-07 MED ORDER — ALPRAZOLAM 0.5 MG PO TABS: 0.5000 mg | ORAL_TABLET | Freq: Every evening | ORAL | 0 refills | Status: AC | PRN

## 2021-11-07 MED ORDER — FLUDEOXYGLUCOSE F - 18 (FDG) INJECTION
7.2000 | Freq: Once | INTRAVENOUS | Status: AC
  Administered 2021-11-07: 7.2 via INTRAVENOUS

## 2021-11-11 ENCOUNTER — Ambulatory Visit: Payer: Medicare HMO | Admitting: Pulmonary Disease

## 2021-11-11 ENCOUNTER — Encounter: Payer: Self-pay | Admitting: Family Medicine

## 2021-11-11 ENCOUNTER — Other Ambulatory Visit (HOSPITAL_COMMUNITY): Payer: Self-pay

## 2021-11-11 DIAGNOSIS — N529 Male erectile dysfunction, unspecified: Secondary | ICD-10-CM

## 2021-11-11 MED ORDER — SILDENAFIL CITRATE 20 MG PO TABS
ORAL_TABLET | ORAL | 2 refills | Status: DC
  Filled 2021-11-11: qty 100, 20d supply, fill #0
  Filled 2021-11-25: qty 100, 30d supply, fill #0
  Filled 2022-02-10: qty 100, 30d supply, fill #1

## 2021-11-15 ENCOUNTER — Other Ambulatory Visit (HOSPITAL_COMMUNITY): Payer: Self-pay

## 2021-11-16 ENCOUNTER — Ambulatory Visit: Payer: Medicare HMO | Admitting: Neurology

## 2021-11-17 ENCOUNTER — Telehealth: Payer: Self-pay | Admitting: Family Medicine

## 2021-11-17 NOTE — Telephone Encounter (Signed)
Left message for patient to call back and schedule Medicare Annual Wellness Visit (AWV) in office.  ? ?If not able to come in office, please offer to do virtually or by telephone.  Left office number and my jabber [phone]. ? ?Last AWV:09/10/2020 ? ?Please schedule at anytime with Nurse Health Advisor. ?  ?

## 2021-11-23 ENCOUNTER — Ambulatory Visit: Payer: Medicare HMO | Admitting: Pulmonary Disease

## 2021-11-25 ENCOUNTER — Other Ambulatory Visit (HOSPITAL_COMMUNITY): Payer: Self-pay

## 2021-11-28 ENCOUNTER — Ambulatory Visit: Payer: Medicare HMO | Admitting: Pulmonary Disease

## 2021-11-28 NOTE — Progress Notes (Signed)
[name], please let Dr. Gerlene Burdock know that his PET scan revealed resolution of the nodule that we were following.  He can return to low-dose lung cancer screening CT program in February 2024. ? ?Thanks, ? ?BLI ? ?Thomas Nash, DO ?[facility] ?11/28/2021 3:47 PM  ?

## 2021-11-28 NOTE — Progress Notes (Incomplete)
? ?Synopsis: Referred in February 2023 for abnormal lung cancer screening CT by Carollee Herter, Alferd Apa, * ? ?Subjective:  ? ?PATIENT ID: Thomas Levy GENDER: male DOB: 1956/02/01, MRN: [id number] ? ?No chief complaint on file. ? ? ?This is a 66 year old gentleman, past medical history of hypertension, hyperlipidemia.  Patient is a retired Pharmacist, community from town.  He has a longstanding history of tobacco use.  He quit a couple of years ago.  Smoked on and off and occasionally had cigar use.  Had a lung cancer screening CT completed by primary care which showed a 15 mm right lower lobe pulmonary nodule.  Also has some associated bronchial thickening.  Some mild areas of upper lobe emphysematous changes.  We reviewed his CT images today in the office. ? ?Of note, his father is a former Acupuncturist at [employer].  And he grew up in [place] and went to high school there.  He then went to [employer] for an engineering degree and ultimately went to dental school at [employer] with his twin brother.  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 ? ?OV 11/28/2021: Here today for follow-up after recent CT scan of the chest.  Patient had follow-up CT imaging/nuclear medicine pet imaging completed on 11/07/2021.  This revealed a nearly resolved location of right sided pulmonary nodularity.  Can restart screening program in 6 months. *** ? ? ?Past Medical History:  ?Diagnosis Date  ?? Hyperlipidemia   ?? Hypertension   ?  ? ?Family History  ?Problem Relation Age of Onset  ?? Hypertension Mother   ?? Cancer Mother   ?     breast  ?? Kidney disease Father   ?     kidney disease-- on dialysis  ?? Hypertension Brother   ?? Hyperlipidemia Brother   ?? Thyroid cancer Child   ?  ? ?Past Surgical History:  ?Procedure Laterality Date  ?? HERNIA REPAIR    ?? VASECTOMY    ? ? ?Social History  ? ?Socioeconomic History  ?? Marital status: Married  ?  Spouse name: Not on file  ?? Number of children: Not on file   ?? Years of education: Not on file  ?? Highest education level: Not on file  ?Occupational History  ?? Not on file  ?Tobacco Use  ?? Smoking status: Former  ?  Types: Cigars  ?  Start date: 08/29/2008  ?? Smokeless tobacco: Never  ?? Tobacco comments:  ?  Quit smoking cigaretts 5 years ago  ?  Smoked 1 cig a day x 30 years   ?Substance and Sexual Activity  ?? Alcohol use: Yes  ?  Alcohol/week: 7.0 standard drinks  ?  Types: 7 Glasses of wine per week  ?? Drug use: No  ?? Sexual activity: Yes  ?  Partners: Female  ?Other Topics Concern  ?? Not on file  ?Social History Narrative  ? Gym-- 4 days a week for 1 hour  ?   ? Right Handed  ? Lives in a one story home   ? Drinks no Caffeine   ? ?Social Determinants of Health  ? ?Financial Resource Strain: Not on file  ?Food Insecurity: Not on file  ?Transportation Needs: Not on file  ?Physical Activity: Not on file  ?Stress: Not on file  ?Social Connections: Not on file  ?Intimate Partner Violence: Not on file  ?  ? ?No Known Allergies  ? ?Outpatient Medications Prior to Visit  ?Medication  Sig Dispense Refill  ?? albuterol (VENTOLIN HFA) 108 (90 Base) MCG/ACT inhaler Inhale 2 puffs into the lungs every 6 (six) hours as needed for wheezing or shortness of breath. 18 g 5  ?? allopurinol (ZYLOPRIM) 300 MG tablet Take 1 tablet (300 mg total) by mouth daily. 90 tablet 1  ?? ALPRAZolam (XANAX) 0.5 MG tablet Take 1 tablet (0.5 mg total) by mouth at bedtime as needed for anxiety. 30 tablet 0  ?? atorvastatin (LIPITOR) 20 MG tablet TAKE 1 TABLET BY MOUTH EVERYDAY AT BEDTIME 90 tablet 1  ?? carbidopa-levodopa (SINEMET IR) 25-100 MG tablet Take 1 tablet by mouth 3 (three) times daily. 7am/11am/4pm 270 tablet 1  ?? clotrimazole-betamethasone (LOTRISONE) lotion Apply topically 2 (two) times daily. x's 14 days 30 mL 0  ?? lisinopril-hydrochlorothiazide (ZESTORETIC) 20-12.5 MG tablet TAKE 2 TABLETS BY MOUTH EVERY DAY 180 tablet 1  ?? nystatin (MYCOSTATIN/NYSTOP) powder Apply topically 4  (four) times daily. 15 g 0  ?? sildenafil (REVATIO) 20 MG tablet Take 1-5 tablets by mouth one hour prior to intercourse as needed 100 tablet 2  ? ?No facility-administered medications prior to visit.  ? ? ?ROS ? ? ?Objective:  ?Physical Exam ? ? ?There were no vitals filed for this visit. ? ?  on RA ?BMI Readings from Last 3 Encounters:  ?10/21/21 23.57 kg/m?  ?09/27/21 27.36 kg/m?  ?09/27/21 24.05 kg/m?  ? ?Wt Readings from Last 3 Encounters:  ?10/21/21 146 lb (66.2 kg)  ?09/27/21 149 lb 9.6 oz (67.9 kg)  ?09/27/21 149 lb (67.6 kg)  ? ? ? ?CBC ?   ?Component Value Date/Time  ? WBC 6.1 09/27/2021 1121  ? RBC 4.59 09/27/2021 1121  ? HGB 15.1 09/27/2021 1121  ? HCT 44.4 09/27/2021 1121  ? PLT 175.0 09/27/2021 1121  ? MCV 96.7 09/27/2021 1121  ? MCH 32.7 09/10/2020 1400  ? MCHC 34.0 09/27/2021 1121  ? RDW 13.2 09/27/2021 1121  ? LYMPHSABS 2.9 09/27/2021 1121  ? MONOABS 0.4 09/27/2021 1121  ? EOSABS 0.4 09/27/2021 1121  ? BASOSABS 0.0 09/27/2021 1121  ? ? ? ?Chest Imaging: ?CT chest lung cancer screening: ?Right lower lobe 15 mm pulmonary nodule. ?The patient's images have been independently reviewed by me.   ? ?Pulmonary Functions Testing Results: ?   ? View : No data to display.  ?  ?  ?  ? ? ?FeNO:  ? ?Pathology:  ? ?Echocardiogram:  ? ?Heart Catheterization:  ?   ?Assessment & Plan:  ? ?No diagnosis found. ? ? ?Discussion: ? ?This is a 66 year old gentleman, abnormal CT screening of the lung with a 15 mm right lower lobe pulmonary nodule.  He is a former smoker. ? ?Plan: ?I think next step for evaluation of the nodule would include a nuclear medicine pet image. ?Pending upon the results of the nuclear medicine pet image we can consider next steps. ?We discussed the role of bronchoscopy and tissue biopsy in this setting. ?I think based upon the PET scan results we may need to discuss other options.  If the PET scan is concerning for malignancy then we may need to consider even removal with thoracic surgery. ?I think  we will have a clear understanding of what we need to do with the nodule after the pet imaging. ?We talked about this in detail with the patient. ? ?Patient is agreeable to this plan. ?Return to clinic in approximately 2 to 3 weeks after the PET imaging is complete. ? ? ?Current Outpatient Medications:  ??  albuterol (VENTOLIN HFA) 108 (90 Base) MCG/ACT inhaler, Inhale 2 puffs into the lungs every 6 (six) hours as needed for wheezing or shortness of breath., Disp: 18 g, Rfl: 5 ??  allopurinol (ZYLOPRIM) 300 MG tablet, Take 1 tablet (300 mg total) by mouth daily., Disp: 90 tablet, Rfl: 1 ??  ALPRAZolam (XANAX) 0.5 MG tablet, Take 1 tablet (0.5 mg total) by mouth at bedtime as needed for anxiety., Disp: 30 tablet, Rfl: 0 ??  atorvastatin (LIPITOR) 20 MG tablet, TAKE 1 TABLET BY MOUTH EVERYDAY AT BEDTIME, Disp: 90 tablet, Rfl: 1 ??  carbidopa-levodopa (SINEMET IR) 25-100 MG tablet, Take 1 tablet by mouth 3 (three) times daily. 7am/11am/4pm, Disp: 270 tablet, Rfl: 1 ??  clotrimazole-betamethasone (LOTRISONE) lotion, Apply topically 2 (two) times daily. x's 14 days, Disp: 30 mL, Rfl: 0 ??  lisinopril-hydrochlorothiazide (ZESTORETIC) 20-12.5 MG tablet, TAKE 2 TABLETS BY MOUTH EVERY DAY, Disp: 180 tablet, Rfl: 1 ??  nystatin (MYCOSTATIN/NYSTOP) powder, Apply topically 4 (four) times daily., Disp: 15 g, Rfl: 0 ??  sildenafil (REVATIO) 20 MG tablet, Take 1-5 tablets by mouth one hour prior to intercourse as needed, Disp: 100 tablet, Rfl: 2 ? ? ? ?Garner Nash, DO ?[facility] ?11/28/2021 12:06 PM   ?

## 2021-11-29 ENCOUNTER — Encounter: Payer: Self-pay | Admitting: Family Medicine

## 2021-11-29 ENCOUNTER — Encounter: Payer: Self-pay | Admitting: Pulmonary Disease

## 2021-11-29 NOTE — Telephone Encounter (Signed)
Will forward to lung nodule clinic so they know to follow up with patient closer to next LDCT.  ?

## 2021-11-30 ENCOUNTER — Other Ambulatory Visit (HOSPITAL_COMMUNITY): Payer: Self-pay

## 2021-12-04 ENCOUNTER — Encounter: Payer: Self-pay | Admitting: Family Medicine

## 2021-12-04 DIAGNOSIS — J9801 Acute bronchospasm: Secondary | ICD-10-CM

## 2021-12-05 MED ORDER — ALBUTEROL SULFATE HFA 108 (90 BASE) MCG/ACT IN AERS: 2.0000 | INHALATION_SPRAY | Freq: Four times a day (QID) | RESPIRATORY_TRACT | 5 refills | Status: DC | PRN

## 2021-12-06 ENCOUNTER — Other Ambulatory Visit: Payer: Self-pay | Admitting: Family Medicine

## 2021-12-06 DIAGNOSIS — R911 Solitary pulmonary nodule: Secondary | ICD-10-CM

## 2021-12-13 ENCOUNTER — Encounter: Payer: Self-pay | Admitting: Family Medicine

## 2021-12-13 ENCOUNTER — Telehealth (INDEPENDENT_AMBULATORY_CARE_PROVIDER_SITE_OTHER): Payer: Medicare HMO | Admitting: Family Medicine

## 2021-12-13 VITALS — Temp 98.7°F

## 2021-12-13 DIAGNOSIS — J40 Bronchitis, not specified as acute or chronic: Secondary | ICD-10-CM | POA: Diagnosis not present

## 2021-12-13 MED ORDER — AZITHROMYCIN 250 MG PO TABS: ORAL_TABLET | ORAL | 0 refills | Status: DC

## 2021-12-13 MED ORDER — PROMETHAZINE-DM 6.25-15 MG/5ML PO SYRP
5.0000 mL | ORAL_SOLUTION | Freq: Four times a day (QID) | ORAL | 0 refills | Status: DC | PRN
Start: 2021-12-13 — End: 2021-12-23

## 2021-12-13 NOTE — Telephone Encounter (Signed)
Pt had virtual today ?

## 2021-12-13 NOTE — Telephone Encounter (Signed)
Talk with Pt and Appointment made ?

## 2021-12-13 NOTE — Progress Notes (Signed)
? ? ?MyChart Video Visit ? ? ? ?Virtual Visit via Video Note  ? ?This visit type was conducted due to national recommendations for restrictions regarding the COVID-19 Pandemic (e.g. social distancing) in an effort to limit this patient's exposure and mitigate transmission in our community. This patient is at least at moderate risk for complications without adequate follow up. This format is felt to be most appropriate for this patient at this time. Physical exam was limited by quality of the video and audio technology used for the visit. Alinda Dooms was able to get the patient set up on a video visit. ? ?Patient location: Home Patient and provider in visit ?Provider location: Office ? ?I discussed the limitations of evaluation and management by telemedicine and the availability of in person appointments. The patient expressed understanding and agreed to proceed. ? ?Visit Date: 12/13/2021 ? ?Today's healthcare provider: Ann Held, DO  ? ? ? ?Subjective:  ? ? Patient ID: Thomas Levy, male    DOB: 1956/04/05, 66 y.o.   MRN: [id number] ? ?Chief Complaint  ?Patient presents with  ? Cough  ?  Productive cough for 14 days, Two negative COVID test  ? ? ?Cough ?Pertinent negatives include no chest pain, fever, headaches, rash or shortness of breath. There is no history of environmental allergies.  ?Patient is in today for a video visit.  ? ?He complains of a productive cough for the past 2 weeks. He has a yellowish-green sputum production. He cannot sleep well at night with his cough. He reports since coming back from his cruise vacation on 11/26/2021 he had a mild sore throat that developed into a cough. His cough has not improved since then. He denies having any fever and reports his temperature measured 98.7 degrees F. He did not check his blood pressure today. He is taking robitussin to manage his cough and finds relief during the day.  ? ? ?Past Medical History:  ?Diagnosis Date  ? Hyperlipidemia   ?  Hypertension   ? ? ?Past Surgical History:  ?Procedure Laterality Date  ? HERNIA REPAIR    ? VASECTOMY    ? ? ?Family History  ?Problem Relation Age of Onset  ? Hypertension Mother   ? Cancer Mother   ?     breast  ? Kidney disease Father   ?     kidney disease-- on dialysis  ? Hypertension Brother   ? Hyperlipidemia Brother   ? Thyroid cancer Child   ? ? ?Social History  ? ?Socioeconomic History  ? Marital status: Married  ?  Spouse name: Not on file  ? Number of children: Not on file  ? Years of education: Not on file  ? Highest education level: Not on file  ?Occupational History  ? Not on file  ?Tobacco Use  ? Smoking status: Former  ?  Types: Cigars  ?  Start date: 08/29/2008  ? Smokeless tobacco: Never  ? Tobacco comments:  ?  Quit smoking cigaretts 5 years ago  ?  Smoked 1 cig a day x 30 years   ?Substance and Sexual Activity  ? Alcohol use: Yes  ?  Alcohol/week: 7.0 standard drinks  ?  Types: 7 Glasses of wine per week  ? Drug use: No  ? Sexual activity: Yes  ?  Partners: Female  ?Other Topics Concern  ? Not on file  ?Social History Narrative  ? Gym-- 4 days a week for 1 hour  ?   ?  Right Handed  ? Lives in a one story home   ? Drinks no Caffeine   ? ?Social Determinants of Health  ? ?Financial Resource Strain: Not on file  ?Food Insecurity: Not on file  ?Transportation Needs: Not on file  ?Physical Activity: Not on file  ?Stress: Not on file  ?Social Connections: Not on file  ?Intimate Partner Violence: Not on file  ? ? ?Outpatient Medications Prior to Visit  ?Medication Sig Dispense Refill  ? albuterol (VENTOLIN HFA) 108 (90 Base) MCG/ACT inhaler Inhale 2 puffs into the lungs every 6 (six) hours as needed for wheezing or shortness of breath. 18 g 5  ? allopurinol (ZYLOPRIM) 300 MG tablet Take 1 tablet (300 mg total) by mouth daily. 90 tablet 1  ? ALPRAZolam (XANAX) 0.5 MG tablet Take 1 tablet (0.5 mg total) by mouth at bedtime as needed for anxiety. 30 tablet 0  ? atorvastatin (LIPITOR) 20 MG tablet TAKE 1  TABLET BY MOUTH EVERYDAY AT BEDTIME 90 tablet 1  ? carbidopa-levodopa (SINEMET IR) 25-100 MG tablet Take 1 tablet by mouth 3 (three) times daily. 7am/11am/4pm 270 tablet 1  ? clotrimazole-betamethasone (LOTRISONE) lotion Apply topically 2 (two) times daily. x's 14 days 30 mL 0  ? lisinopril-hydrochlorothiazide (ZESTORETIC) 20-12.5 MG tablet TAKE 2 TABLETS BY MOUTH EVERY DAY 180 tablet 1  ? nystatin (MYCOSTATIN/NYSTOP) powder Apply topically 4 (four) times daily. 15 g 0  ? sildenafil (REVATIO) 20 MG tablet Take 1-5 tablets by mouth one hour prior to intercourse as needed 100 tablet 2  ? ?No facility-administered medications prior to visit.  ? ? ?No Known Allergies ? ?Review of Systems  ?Constitutional:  Negative for fever and malaise/fatigue.  ?HENT:  Negative for congestion.   ?Eyes:  Negative for blurred vision.  ?Respiratory:  Positive for cough and sputum production (yellow-green). Negative for shortness of breath.   ?Cardiovascular:  Negative for chest pain, palpitations and leg swelling.  ?Gastrointestinal:  Negative for abdominal pain, blood in stool and nausea.  ?Genitourinary:  Negative for dysuria and frequency.  ?Musculoskeletal:  Negative for falls.  ?Skin:  Negative for rash.  ?Neurological:  Negative for dizziness, loss of consciousness and headaches.  ?Endo/Heme/Allergies:  Negative for environmental allergies.  ?Psychiatric/Behavioral:  Negative for depression. The patient is not nervous/anxious.   ? ?   ?Objective:  ?  ?Physical Exam ?Vitals and nursing note reviewed.  ?Pulmonary:  ?   Effort: Pulmonary effort is normal.  ? ? ?Temp 98.7 ?F (37.1 ?C)  ?Wt Readings from Last 3 Encounters:  ?10/21/21 146 lb (66.2 kg)  ?09/27/21 149 lb 9.6 oz (67.9 kg)  ?09/27/21 149 lb (67.6 kg)  ? ? ?Diabetic Foot Exam - Simple   ?No data filed ?  ? ?Lab Results  ?Component Value Date  ? WBC 6.1 09/27/2021  ? HGB 15.1 09/27/2021  ? HCT 44.4 09/27/2021  ? PLT 175.0 09/27/2021  ? GLUCOSE 94 09/27/2021  ? CHOL 174  09/27/2021  ? TRIG 79.0 09/27/2021  ? HDL 71.30 09/27/2021  ? [place] 87 09/27/2021  ? ALT 19 09/27/2021  ? AST 27 09/27/2021  ? NA 137 09/27/2021  ? K 4.1 09/27/2021  ? CL 97 09/27/2021  ? CREATININE 0.99 09/27/2021  ? BUN 17 09/27/2021  ? CO2 33 (H) 09/27/2021  ? TSH 1.41 02/16/2021  ? PSA 1.00 09/27/2021  ? ? ?Lab Results  ?Component Value Date  ? TSH 1.41 02/16/2021  ? ?Lab Results  ?Component Value Date  ? WBC 6.1  09/27/2021  ? HGB 15.1 09/27/2021  ? HCT 44.4 09/27/2021  ? MCV 96.7 09/27/2021  ? PLT 175.0 09/27/2021  ? ?Lab Results  ?Component Value Date  ? NA 137 09/27/2021  ? K 4.1 09/27/2021  ? CO2 33 (H) 09/27/2021  ? GLUCOSE 94 09/27/2021  ? BUN 17 09/27/2021  ? CREATININE 0.99 09/27/2021  ? BILITOT 1.1 09/27/2021  ? ALKPHOS 62 09/27/2021  ? AST 27 09/27/2021  ? ALT 19 09/27/2021  ? PROT 7.2 09/27/2021  ? ALBUMIN 4.5 09/27/2021  ? CALCIUM 9.7 09/27/2021  ? GFR 79.36 09/27/2021  ? ?Lab Results  ?Component Value Date  ? CHOL 174 09/27/2021  ? ?Lab Results  ?Component Value Date  ? HDL 71.30 09/27/2021  ? ?Lab Results  ?Component Value Date  ? [place] 87 09/27/2021  ? ?Lab Results  ?Component Value Date  ? TRIG 79.0 09/27/2021  ? ?Lab Results  ?Component Value Date  ? CHOLHDL 2 09/27/2021  ? ?No results found for: HGBA1C ? ?   ?Assessment & Plan:  ? ?Problem List Items Addressed This Visit   ? ?  ? Unprioritized  ? Bronchitis - Primary  ?  z pak  ?Phenergan dm  ?covid x2 neg  ?Call or rto prn  ? ?  ?  ? Relevant Medications  ? azithromycin (ZITHROMAX Z-PAK) 250 MG tablet  ? promethazine-dextromethorphan (PROMETHAZINE-DM) 6.25-15 MG/5ML syrup  ? ? ? ?Meds ordered this encounter  ?Medications  ? azithromycin (ZITHROMAX Z-PAK) 250 MG tablet  ?  Sig: As directed  ?  Dispense:  6 each  ?  Refill:  0  ? promethazine-dextromethorphan (PROMETHAZINE-DM) 6.25-15 MG/5ML syrup  ?  Sig: Take 5 mLs by mouth 4 (four) times daily as needed.  ?  Dispense:  118 mL  ?  Refill:  0  ? ? ?I discussed the assessment and treatment  plan with the patient. The patient was provided an opportunity to ask questions and all were answered. The patient agreed with the plan and demonstrated an understanding of the instructions. ?  ?The patient was

## 2021-12-13 NOTE — Assessment & Plan Note (Signed)
z pak  ?Phenergan dm  ?covid x2 neg  ?Call or rto prn  ?

## 2021-12-16 ENCOUNTER — Encounter: Payer: Self-pay | Admitting: Neurology

## 2021-12-22 NOTE — Progress Notes (Signed)
? ?Subjective:  ? Thomas Levy is a 66 y.o. male who presents for an Initial Medicare Annual Wellness Visit. ? ?I connected with  Atha Starks on 12/23/21 by a audio enabled telemedicine application and verified that I am speaking with the correct person using two identifiers. ? ?Patient Location: Home ? ?Provider Location: Office/Clinic ? ?I discussed the limitations of evaluation and management by telemedicine. The patient expressed understanding and agreed to proceed.  ? ?Review of Systems    ? ?Cardiac Risk Factors include: advanced age (>17mn, >>62women);hypertension;dyslipidemia ? ?   ?Objective:  ?  ?There were no vitals filed for this visit. ?There is no height or weight on file to calculate BMI. ? ? ?  12/23/2021  ?  8:23 AM 09/27/2021  ? 12:59 PM 02/16/2021  ?  8:38 AM 09/10/2020  ?  1:37 PM  ?Advanced Directives  ?Does Patient Have a Medical Advance Directive? Yes Yes Yes Yes  ?Type of [place] of [place] DNR (pink MOST or yellow form);Living will Living will [place]Living will;Out of facility DNR (pink MOST or yellow form) [place]  ?Does patient want to make changes to medical advance directive?    No - Patient declined  ?Copy of [place]? No - copy requested   No - copy requested  ? ? ?Current Medications (verified) ?Outpatient Encounter Medications as of 12/23/2021  ?Medication Sig  ? albuterol (VENTOLIN HFA) 108 (90 Base) MCG/ACT inhaler Inhale 2 puffs into the lungs every 6 (six) hours as needed for wheezing or shortness of breath.  ? allopurinol (ZYLOPRIM) 300 MG tablet Take 1 tablet (300 mg total) by mouth daily.  ? ALPRAZolam (XANAX) 0.5 MG tablet Take 1 tablet (0.5 mg total) by mouth at bedtime as needed for anxiety.  ? atorvastatin (LIPITOR) 20 MG tablet TAKE 1 TABLET BY MOUTH EVERYDAY AT BEDTIME  ? carbidopa-levodopa (SINEMET IR) 25-100 MG tablet Take 1 tablet by mouth  3 (three) times daily. 7am/11am/4pm  ? clotrimazole-betamethasone (LOTRISONE) lotion Apply topically 2 (two) times daily. x's 14 days  ? lisinopril-hydrochlorothiazide (ZESTORETIC) 20-12.5 MG tablet TAKE 2 TABLETS BY MOUTH EVERY DAY  ? nystatin (MYCOSTATIN/NYSTOP) powder Apply topically 4 (four) times daily.  ? sildenafil (REVATIO) 20 MG tablet Take 1-5 tablets by mouth one hour prior to intercourse as needed  ? [DISCONTINUED] azithromycin (ZITHROMAX Z-PAK) 250 MG tablet As directed  ? [DISCONTINUED] promethazine-dextromethorphan (PROMETHAZINE-DM) 6.25-15 MG/5ML syrup Take 5 mLs by mouth 4 (four) times daily as needed.  ? ?No facility-administered encounter medications on file as of 12/23/2021.  ? ? ?Allergies (verified) ?Patient has no known allergies.  ? ?History: ?Past Medical History:  ?Diagnosis Date  ? Hyperlipidemia   ? Hypertension   ? ?Past Surgical History:  ?Procedure Laterality Date  ? HERNIA REPAIR    ? VASECTOMY    ? ?Family History  ?Problem Relation Age of Onset  ? Hypertension Mother   ? Cancer Mother   ?     breast  ? Kidney disease Father   ?     kidney disease-- on dialysis  ? Hypertension Brother   ? Hyperlipidemia Brother   ? Thyroid cancer Child   ? ?Social History  ? ?Socioeconomic History  ? Marital status: Married  ?  Spouse name: Not on file  ? Number of children: Not on file  ? Years of education: Not on file  ? Highest education level:  Not on file  ?Occupational History  ? Not on file  ?Tobacco Use  ? Smoking status: Former  ?  Types: Cigars  ?  Start date: 08/29/2008  ? Smokeless tobacco: Never  ? Tobacco comments:  ?  Quit smoking cigaretts 5 years ago  ?  Smoked 1 cig a day x 30 years   ?Substance and Sexual Activity  ? Alcohol use: Yes  ?  Alcohol/week: 7.0 standard drinks  ?  Types: 7 Glasses of wine per week  ? Drug use: No  ? Sexual activity: Yes  ?  Partners: Female  ?Other Topics Concern  ? Not on file  ?Social History Narrative  ? Gym-- 4 days a week for 1 hour  ?   ? Right Handed   ? Lives in a one story home   ? Drinks no Caffeine   ? ?Social Determinants of Health  ? ?Financial Resource Strain: Low Risk   ? Difficulty of Paying Living Expenses: Not hard at all  ?Food Insecurity: No Food Insecurity  ? Worried About Charity fundraiser in the Last Year: Never true  ? Ran Out of Food in the Last Year: Never true  ?Transportation Needs: No Transportation Needs  ? Lack of Transportation (Medical): No  ? Lack of Transportation (Non-Medical): No  ?Physical Activity: Sufficiently Active  ? Days of Exercise per Week: 7 days  ? Minutes of Exercise per Session: 60 min  ?Stress: No Stress Concern Present  ? Feeling of Stress : Not at all  ?Social Connections: Moderately Isolated  ? Frequency of Communication with Friends and Family: More than three times a week  ? Frequency of Social Gatherings with Friends and Family: Once a week  ? Attends Religious Services: Never  ? Active Member of Clubs or Organizations: No  ? Attends Archivist Meetings: Never  ? Marital Status: Married  ? ? ?Tobacco Counseling ?Counseling given: Not Answered ?Tobacco comments: Quit smoking cigaretts 5 years ago ?Smoked 1 cig a day x 30 years  ? ? ?Clinical Intake: ? ?Pre-visit preparation completed: Yes ? ?Pain : No/denies pain ? ?  ? ?Nutritional Risks: None ?Diabetes: No ? ?  ? ?Diabetic?No ? ?Interpreter Needed?: No ? ?Information entered by :: Schelly Chuba ? ? ?Activities of Daily Living ? ?  12/23/2021  ?  8:25 AM  ?In your present state of health, do you have any difficulty performing the following activities:  ?Hearing? 0  ?Vision? 0  ?Difficulty concentrating or making decisions? 0  ?Walking or climbing stairs? 0  ?Dressing or bathing? 0  ?Doing errands, shopping? 0  ?Preparing Food and eating ? N  ?Using the Toilet? N  ?In the past six months, have you accidently leaked urine? N  ?Do you have problems with loss of bowel control? N  ?Managing your Medications? N  ?Managing your Finances? N  ?Housekeeping or  managing your Housekeeping? N  ? ? ?Patient Care Team: ?Carollee Herter, Alferd Apa, DO as PCP - General (Family Medicine) ?Dermatology, [place] ?Sheryn Bison, MD as Referring Physician (Dermatology) ?Eusebio Friendly, MD as Referring Physician (Internal Medicine) ?Agapito Games (Optometry) ?Ludwig Clarks, DO as Consulting Physician (Neurology) ? ?Indicate any recent Medical Services you may have received from other than [facility] providers in the past year (date may be approximate). ? ?   ?Assessment:  ? This is a routine wellness examination for Thomas Levy. ? ?Hearing/Vision screen ?No results found. ? ?Dietary issues and exercise activities discussed: ?  Current Exercise Habits: Home exercise routine, Type of exercise: walking;calisthenics, Time (Minutes): 60, Frequency (Times/Week): 7, Weekly Exercise (Minutes/Week): 420, Intensity: Mild ? ? Goals Addressed   ?None ?  ? ?Depression Screen ? ?  12/23/2021  ?  8:24 AM 02/10/2021  ?  8:24 AM 09/10/2020  ?  1:10 PM 09/05/2019  ?  8:34 AM 08/30/2018  ?  9:20 AM 05/18/2016  ?  1:34 PM  ?PHQ 2/9 Scores  ?PHQ - 2 Score 0 1 0 0 0 0  ?PHQ- 9 Score     0   ?Exception Documentation  Medical reason      ?  ?Fall Risk ? ?  12/23/2021  ?  8:24 AM 09/27/2021  ? 12:59 PM 02/16/2021  ?  8:37 AM 02/10/2021  ?  8:23 AM 09/10/2020  ?  1:34 PM  ?Fall Risk   ?Falls in the past year? 0 0 0 0 0  ?Number falls in past yr: 0 0 0 0 0  ?Injury with Fall? 0 0 0 0 0  ?Risk for fall due to : No Fall Risks      ?Follow up Falls evaluation completed    Falls evaluation completed  ? ? ?FALL RISK PREVENTION PERTAINING TO THE HOME: ? ?Any stairs in or around the home? No  ?If so, are there any without handrails?  N/A ?Home free of loose throw rugs in walkways, pet beds, electrical cords, etc? Yes  ?Adequate lighting in your home to reduce risk of falls? Yes  ? ?ASSISTIVE DEVICES UTILIZED TO PREVENT FALLS: ? ?Life alert? No  ?Use of a cane, walker or w/c? No  ?Grab bars in the bathroom? No  ?Shower chair or  bench in shower? No  ?Elevated toilet seat or a handicapped toilet? No  ? ?TIMED UP AND GO: ? ?Was the test performed? No .  ? ? ?Cognitive Function: ? ?  09/10/2020  ?  1:36 PM  ?MMSE - Mini Mental S

## 2021-12-23 ENCOUNTER — Ambulatory Visit (INDEPENDENT_AMBULATORY_CARE_PROVIDER_SITE_OTHER): Payer: Medicare HMO

## 2021-12-23 DIAGNOSIS — Z1211 Encounter for screening for malignant neoplasm of colon: Secondary | ICD-10-CM | POA: Diagnosis not present

## 2021-12-23 DIAGNOSIS — Z0000 Encounter for general adult medical examination without abnormal findings: Secondary | ICD-10-CM | POA: Diagnosis not present

## 2021-12-23 NOTE — Patient Instructions (Signed)
Thomas Levy , ?Thank you for taking time to come for your Medicare Wellness Visit. I appreciate your ongoing commitment to your health goals. Please review the following plan we discussed and let me know if I can assist you in the future.  ? ?Screening recommendations/referrals: ?Colonoscopy: ordered 12/23/21 ?Recommended yearly ophthalmology/optometry visit for glaucoma screening and checkup ?Recommended yearly dental visit for hygiene and checkup ? ?Vaccinations: ?Influenza vaccine: up to date  ?Pneumococcal vaccine: up to date ?Tdap vaccine: up to date ?Shingles vaccine: up to date   ?Covid-19: completed ? ?Advanced directives: yes, not on file ? ?Conditions/risks identified: see problem list ? ?Next appointment: Follow up in one year for your annual wellness visit.  ? ?Preventive Care 66 Years and Older, Male ?Preventive care refers to lifestyle choices and visits with your health care provider that can promote health and wellness. ?What does preventive care include? ?A yearly physical exam. This is also called an annual well check. ?Dental exams once or twice a year. ?Routine eye exams. Ask your health care provider how often you should have your eyes checked. ?Personal lifestyle choices, including: ?Daily care of your teeth and gums. ?Regular physical activity. ?Eating a healthy diet. ?Avoiding tobacco and drug use. ?Limiting alcohol use. ?Practicing safe sex. ?Taking low doses of aspirin every day. ?Taking vitamin and mineral supplements as recommended by your health care provider. ?What happens during an annual well check? ?The services and screenings done by your health care provider during your annual well check will depend on your age, overall health, lifestyle risk factors, and family history of disease. ?Counseling  ?Your health care provider may ask you questions about your: ?Alcohol use. ?Tobacco use. ?Drug use. ?Emotional well-being. ?Home and relationship well-being. ?Sexual activity. ?Eating  habits. ?History of falls. ?Memory and ability to understand (cognition). ?Work and work Statistician. ?Screening  ?You may have the following tests or measurements: ?Height, weight, and BMI. ?Blood pressure. ?Lipid and cholesterol levels. These may be checked every 5 years, or more frequently if you are over 62 years old. ?Skin check. ?Lung cancer screening. You may have this screening every year starting at age 31 if you have a 30-pack-year history of smoking and currently smoke or have quit within the past 15 years. ?Fecal occult blood test (FOBT) of the stool. You may have this test every year starting at age 18. ?Flexible sigmoidoscopy or colonoscopy. You may have a sigmoidoscopy every 5 years or a colonoscopy every 10 years starting at age 19. ?Prostate cancer screening. Recommendations will vary depending on your family history and other risks. ?Hepatitis C blood test. ?Hepatitis B blood test. ?Sexually transmitted disease (STD) testing. ?Diabetes screening. This is done by checking your blood sugar (glucose) after you have not eaten for a while (fasting). You may have this done every 1-3 years. ?Abdominal aortic aneurysm (AAA) screening. You may need this if you are a current or former smoker. ?Osteoporosis. You may be screened starting at age 50 if you are at high risk. ?Talk with your health care provider about your test results, treatment options, and if necessary, the need for more tests. ?Vaccines  ?Your health care provider may recommend certain vaccines, such as: ?Influenza vaccine. This is recommended every year. ?Tetanus, diphtheria, and acellular pertussis (Tdap, Td) vaccine. You may need a Td booster every 10 years. ?Zoster vaccine. You may need this after age 65. ?Pneumococcal 13-valent conjugate (PCV13) vaccine. One dose is recommended after age 76. ?Pneumococcal polysaccharide (PPSV23) vaccine. One dose is recommended  after age 48. ?Talk to your health care provider about which screenings and  vaccines you need and how often you need them. ?This information is not intended to replace advice given to you by your health care provider. Make sure you discuss any questions you have with your health care provider. ?Document Released: 09/10/2015 Document Revised: 05/03/2016 Document Reviewed: 06/15/2015 ?Elsevier Interactive Patient Education ? 2017 [employer]. ? ?Fall Prevention in the Home ?Falls can cause injuries. They can happen to people of all ages. There are many things you can do to make your home safe and to help prevent falls. ?What can I do on the outside of my home? ?Regularly fix the edges of walkways and driveways and fix any cracks. ?Remove anything that might make you trip as you walk through a door, such as a raised step or threshold. ?Trim any bushes or trees on the path to your home. ?Use bright outdoor lighting. ?Clear any walking paths of anything that might make someone trip, such as rocks or tools. ?Regularly check to see if handrails are loose or broken. Make sure that both sides of any steps have handrails. ?Any raised decks and porches should have guardrails on the edges. ?Have any leaves, snow, or ice cleared regularly. ?Use sand or salt on walking paths during winter. ?Clean up any spills in your garage right away. This includes oil or grease spills. ?What can I do in the bathroom? ?Use night lights. ?Install grab bars by the toilet and in the tub and shower. Do not use towel bars as grab bars. ?Use non-skid mats or decals in the tub or shower. ?If you need to sit down in the shower, use a plastic, non-slip stool. ?Keep the floor dry. Clean up any water that spills on the floor as soon as it happens. ?Remove soap buildup in the tub or shower regularly. ?Attach bath mats securely with double-sided non-slip rug tape. ?Do not have throw rugs and other things on the floor that can make you trip. ?What can I do in the bedroom? ?Use night lights. ?Make sure that you have a light by your  bed that is easy to reach. ?Do not use any sheets or blankets that are too big for your bed. They should not hang down onto the floor. ?Have a firm chair that has side arms. You can use this for support while you get dressed. ?Do not have throw rugs and other things on the floor that can make you trip. ?What can I do in the kitchen? ?Clean up any spills right away. ?Avoid walking on wet floors. ?Keep items that you use a lot in easy-to-reach places. ?If you need to reach something above you, use a strong step stool that has a grab bar. ?Keep electrical cords out of the way. ?Do not use floor polish or wax that makes floors slippery. If you must use wax, use non-skid floor wax. ?Do not have throw rugs and other things on the floor that can make you trip. ?What can I do with my stairs? ?Do not leave any items on the stairs. ?Make sure that there are handrails on both sides of the stairs and use them. Fix handrails that are broken or loose. Make sure that handrails are as long as the stairways. ?Check any carpeting to make sure that it is firmly attached to the stairs. Fix any carpet that is loose or worn. ?Avoid having throw rugs at the top or bottom of the stairs. If you  do have throw rugs, attach them to the floor with carpet tape. ?Make sure that you have a light switch at the top of the stairs and the bottom of the stairs. If you do not have them, ask someone to add them for you. ?What else can I do to help prevent falls? ?Wear shoes that: ?Do not have high heels. ?Have rubber bottoms. ?Are comfortable and fit you well. ?Are closed at the toe. Do not wear sandals. ?If you use a stepladder: ?Make sure that it is fully opened. Do not climb a closed stepladder. ?Make sure that both sides of the stepladder are locked into place. ?Ask someone to hold it for you, if possible. ?Clearly mark and make sure that you can see: ?Any grab bars or handrails. ?First and last steps. ?Where the edge of each step is. ?Use tools that  help you move around (mobility aids) if they are needed. These include: ?Canes. ?Walkers. ?Scooters. ?Crutches. ?Turn on the lights when you go into a dark area. Replace any light bulbs as soon as they burn out.

## 2021-12-26 ENCOUNTER — Ambulatory Visit: Payer: Medicare HMO | Admitting: Pulmonary Disease

## 2022-01-04 ENCOUNTER — Telehealth: Payer: Self-pay | Admitting: Family Medicine

## 2022-01-04 NOTE — Telephone Encounter (Signed)
Left message for patient to call back and schedule Medicare Annual Wellness Visit (AWV).  ? ?Please offer to do virtually or by telephone.  Left office number and my jabber [phone]. ? ?Last AWV:09/10/2020 ? ?Please schedule at anytime with Nurse Health Advisor. ?  ?

## 2022-01-31 ENCOUNTER — Encounter: Payer: Self-pay | Admitting: Family Medicine

## 2022-01-31 DIAGNOSIS — J9801 Acute bronchospasm: Secondary | ICD-10-CM

## 2022-01-31 MED ORDER — ALBUTEROL SULFATE HFA 108 (90 BASE) MCG/ACT IN AERS: 2.0000 | INHALATION_SPRAY | Freq: Four times a day (QID) | RESPIRATORY_TRACT | 5 refills | Status: DC | PRN

## 2022-02-05 IMAGING — CT CT CHEST LUNG CANCER SCREENING LOW DOSE W/O CM
2 of 3 series · 14 of 36 positions shown, 17 images · non-contrast
Comparison: No priors.

CLINICAL DATA: 65-year-old male former smoker (quit 6 months ago)
with greater than 20 pack-year history of smoking. Lung cancer
screening examination.



[Series 2: axial st · axial · 0.66mm/px · z∈[-300,-40]mm · 11 of 62 slices shown, 14 images]
[im 5/62  mediastinal]
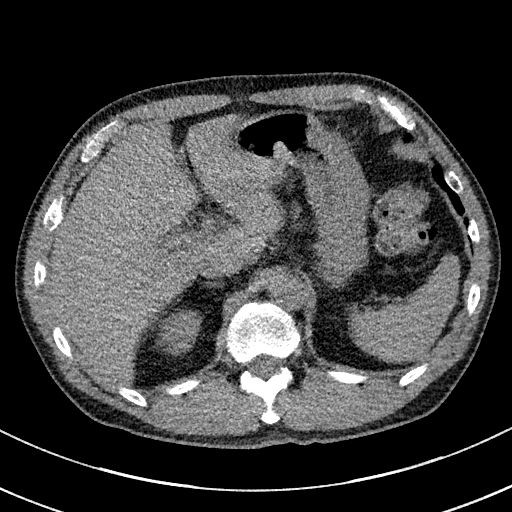
[im 5/62  lung]
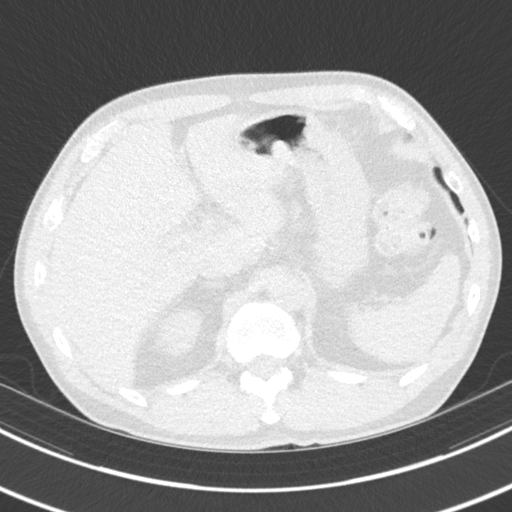
[im 10/62  lung]
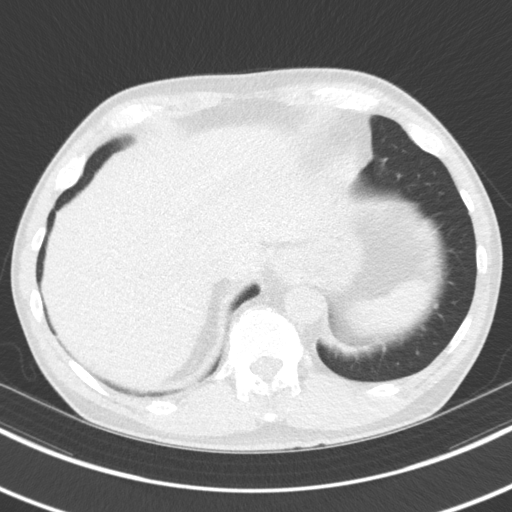
[im 14/62  lung]
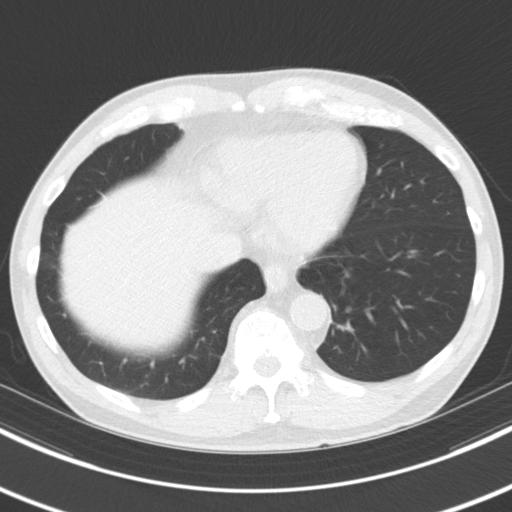
[im 21/62  lung]
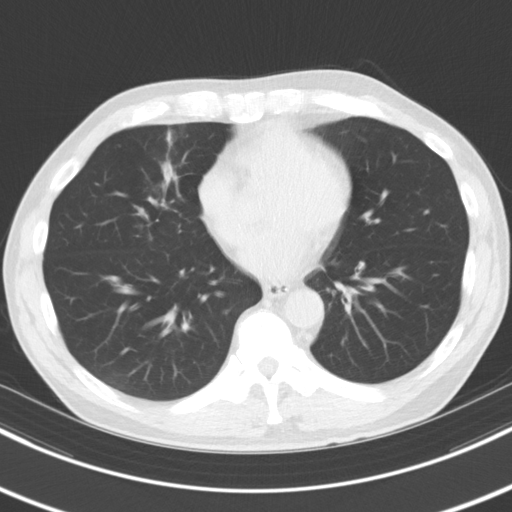
[im 25/62  mediastinal]
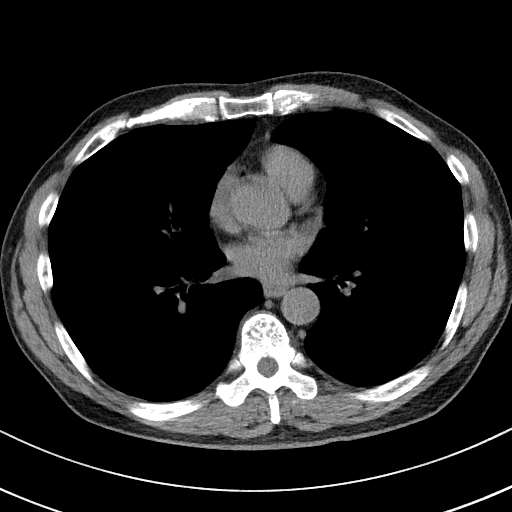
[im 25/62  lung]
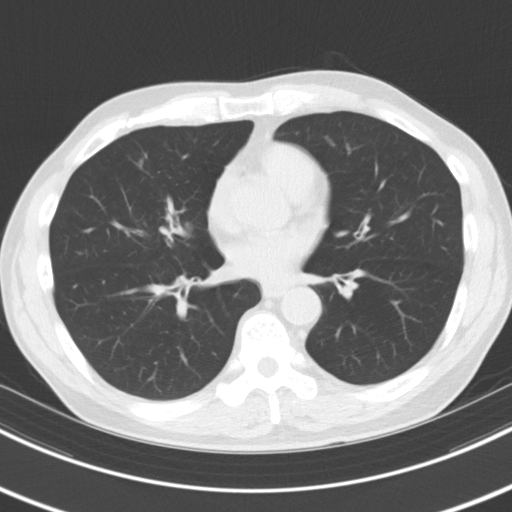
[im 32/62  lung]
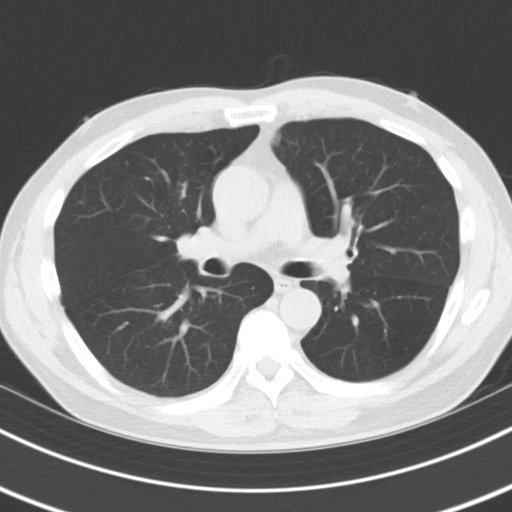
[im 37/62  lung]
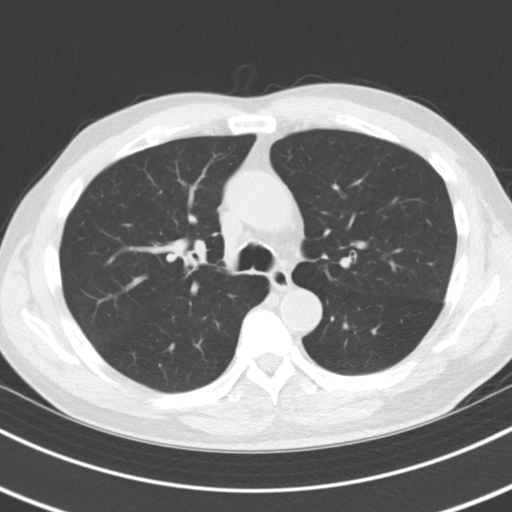
[im 41/62  lung]
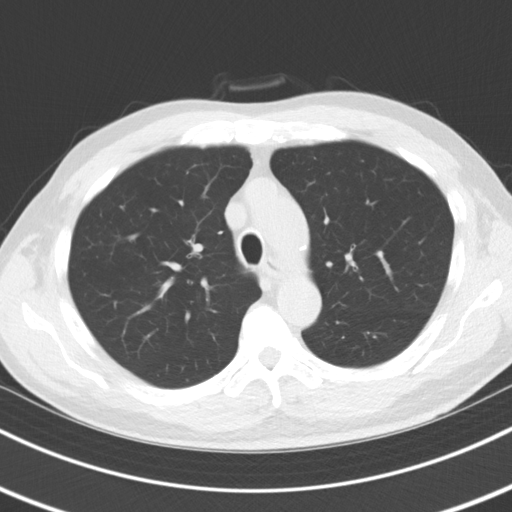
[im 48/62  mediastinal]
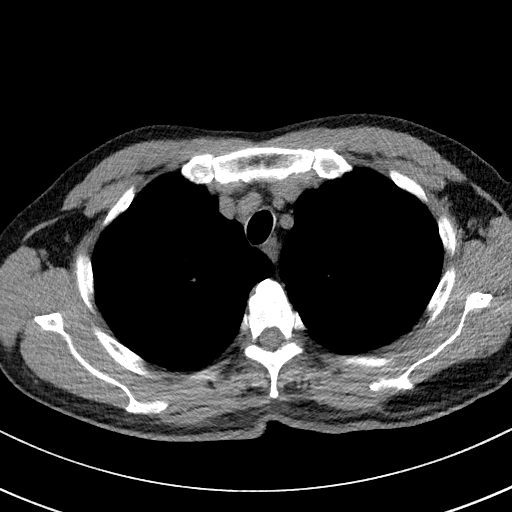
[im 48/62  lung]
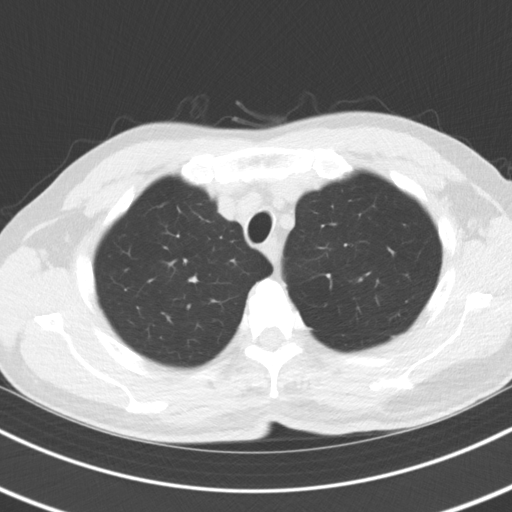
[im 52/62  lung]
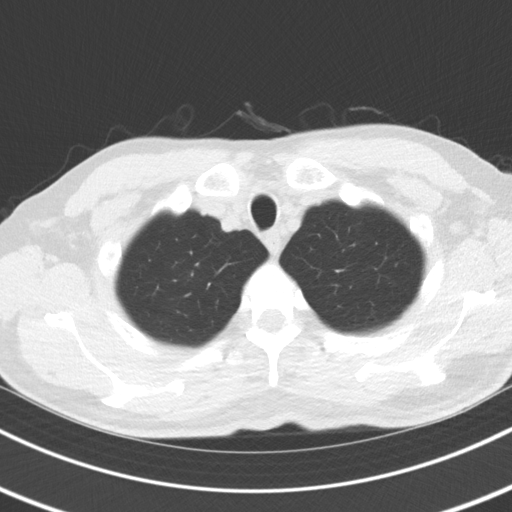
[im 57/62  lung]
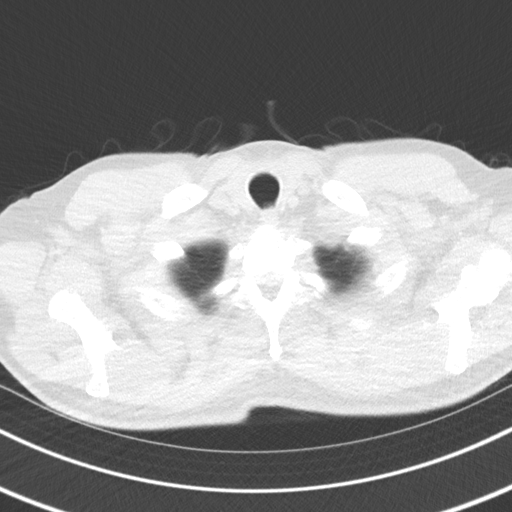

[Series 5: coronal · coronal · 0.64mm/px · 3 of 296 slices shown]
[im 60/296  lung]
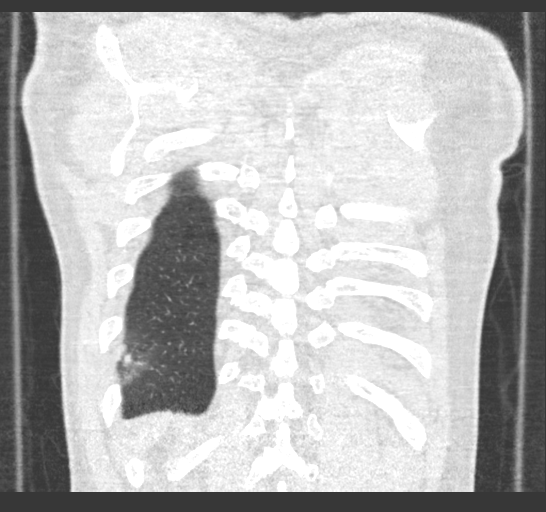
[im 119/296  lung]
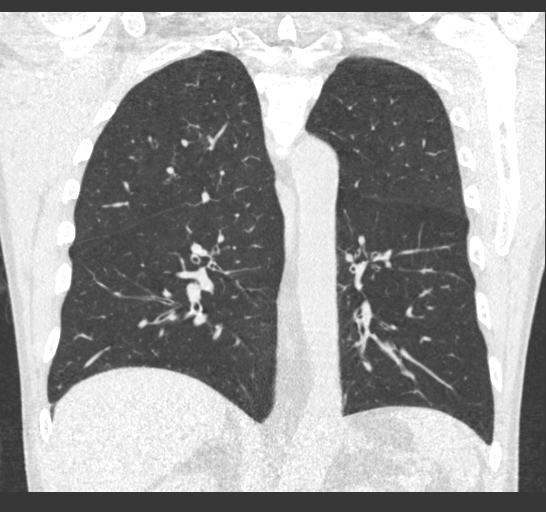
[im 178/296  lung]
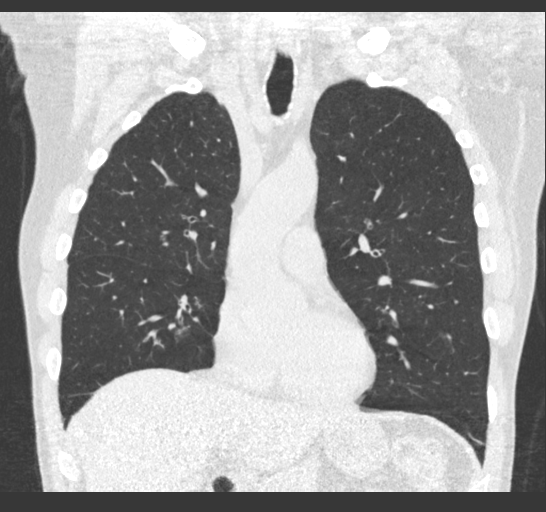

[14 of 36 positions shown; findings below may reference images not displayed]

FINDINGS: Cardiovascular: Heart size is normal. There is no significant
pericardial fluid, thickening or pericardial calcification. There is
aortic atherosclerosis, as well as atherosclerosis of the great
vessels of the mediastinum and the coronary arteries, including
calcified atherosclerotic plaque in the left anterior descending
coronary artery.

Mediastinum/Nodes: No pathologically enlarged mediastinal or hilar
lymph nodes. Please note that accurate exclusion of hilar adenopathy
is limited on noncontrast CT scans. Esophagus is unremarkable in
appearance. No axillary lymphadenopathy.

Lungs/Pleura: There are some areas with thickening of the
peribronchovascular interstitium with some regional nodular
architectural distortion in the lungs, most evident in the right
middle lobe and right lower lobe, favored to be of infectious or
inflammatory etiology. In these regions there are multiple small
nodules, largest of which is in the right lower lobe (axial image
223 of series 3), with a volume derived mean diameter of 15.2 mm. No
pleural effusions. Mild diffuse bronchial wall thickening with very
mild centrilobular and paraseptal emphysema.

Upper Abdomen: Unremarkable.

Musculoskeletal: There are no aggressive appearing lytic or blastic
lesions noted in the visualized portions of the skeleton.
IMPRESSION: 1. There is a spectrum of findings in the right middle and right
lower lobes which are favored to be of infectious or inflammatory
etiology, however, there are nodules in these regions, and today's
examination is accordingly technically categorized as
Lung-RADS 4BS, suspicious. Additional imaging evaluation or
consultation with Pulmonology or Thoracic Surgery recommended. Based
on the appearance on today's examination, however, short-term
follow-up low-dose chest CT without contrast in 3 months (please use
the following order, "CT CHEST LCS NODULE FOLLOW-UP W/O CM") is
recommended.
2. The "S" modifier above refers to potentially clinically
significant non lung cancer related findings. Specifically, there is
aortic atherosclerosis, in addition to left anterior descending
coronary artery disease. Please note that although the presence of
coronary artery calcium documents the presence of coronary artery
disease, the severity of this disease and any potential stenosis
cannot be assessed on this non-gated CT examination. Assessment for
potential risk factor modification, dietary therapy or pharmacologic
therapy may be warranted, if clinically indicated.
3. Mild diffuse bronchial wall thickening with very mild
centrilobular and paraseptal emphysema; imaging findings suggestive
of underlying COPD.

These results will be called to the ordering clinician or
representative by the Radiologist Assistant, and communication
documented in the PACS or [REDACTED].

Aortic Atherosclerosis (YYAWT-V2E.E) and Emphysema (YYAWT-3C5.C).

## 2022-02-09 NOTE — Progress Notes (Unsigned)
Assessment/Plan:   1.  Parkinsonian tremor  -Patient wanted to try levodopa, even though he does not formally meet criteria for Parkinson's.  It did not help the tremor.  May have levodopa resistant tremor.  After some discussion today, we ultimately decided to go ahead and stop that.  -At this point in time, patient has no other features of Parkinson's disease.  Specifically, he does not have bradykinesia, which is required for the diagnosis.  Certainly, Parkinson's could be coming in his future and this is something we are watching out for, but he currently does not meet criteria for it.  We discussed this in detail today as well as prior visits.  -We discussed DaTscan and the newer skin biopsies for alpha-synuclein.  Patient does not see a need to proceed with this right now.   -I reviewed his flowsheets for the last several years regarding pulse try propranolol.  While he has had some pulse rates in the 90s, they generally fall high 60s (74 today).  I told him we could cautiously try low-dose propranolol if he would like to try that to see if that would be beneficial.  He asked me if I can talk to his primary care physician and see if current lisinopril/hydrochlorothiazide could be changed or altered, as his blood pressure was fairly low.  I did that and appreciate the input from primary care.  She is going to decrease his lisinopril/hydrochlorothiazide down to 1 tablet/day.  We will cautiously add low-dose propranolol, 10 mg 3 times per day.  This may not be enough, but he does exercise for several hours per day and I want to make sure that we do not make him lightheaded or dizzy.    Subjective:   Thomas Levy was seen today in follow up for parkinsonian tremor.    My previous records were reviewed prior to todays visit as well as outside records available to me.  Last visit, the patient did not meet criteria for Parkinson's disease.  However, he emailed me a lot longer after and really  wanted to trial medication and we started him on levodopa.  He was not really sure that this helped the tremor particularly.  Current prescribed movement disorder medications: Carbidopa/levodopa 25/100, 1 tablet 3 times per day (hes taking at 8am/11am/4pm)   PREVIOUS MEDICATIONS: none to date  ALLERGIES:  No Known Allergies  CURRENT MEDICATIONS:  Current Meds  Medication Sig   albuterol (VENTOLIN HFA) 108 (90 Base) MCG/ACT inhaler Inhale 2 puffs into the lungs every 6 (six) hours as needed for wheezing or shortness of breath.   allopurinol (ZYLOPRIM) 300 MG tablet Take 1 tablet (300 mg total) by mouth daily.   ALPRAZolam (XANAX) 0.5 MG tablet Take 1 tablet (0.5 mg total) by mouth at bedtime as needed for anxiety.   atorvastatin (LIPITOR) 20 MG tablet TAKE 1 TABLET BY MOUTH EVERYDAY AT BEDTIME   carbidopa-levodopa (SINEMET IR) 25-100 MG tablet Take 1 tablet by mouth 3 (three) times daily. 7am/11am/4pm   clotrimazole-betamethasone (LOTRISONE) lotion Apply topically 2 (two) times daily. x's 14 days   lisinopril-hydrochlorothiazide (ZESTORETIC) 20-12.5 MG tablet TAKE 2 TABLETS BY MOUTH EVERY DAY   nystatin (MYCOSTATIN/NYSTOP) powder Apply topically 4 (four) times daily.   sildenafil (REVATIO) 20 MG tablet Take 1-5 tablets by mouth one hour prior to intercourse as needed     Objective:   PHYSICAL EXAMINATION:    VITALS:   Vitals:   02/14/22 0918  BP: 112/60  Pulse:  74  Resp: 18  SpO2: 98%  Weight: 147 lb (66.7 kg)  Height: '5\' 6"'$  (1.676 m)     GEN:  The patient appears stated age and is in NAD. HEENT:  Normocephalic, atraumatic.  The mucous membranes are moist. The superficial temporal arteries are without ropiness or tenderness. CV:  RRR Lungs:  CTAB Neck/HEME:  There are no carotid bruits bilaterally.  Neurological examination:  Orientation: The patient is alert and oriented x3. Cranial nerves: There is good facial symmetry without facial hypomimia. The speech is fluent  and clear. Soft palate rises symmetrically and there is no tongue deviation. Hearing is intact to conversational tone. Sensation: Sensation is intact to light touch throughout Motor: Strength is at least antigravity x4.  Movement examination: Tone: There is normal tone in the upper and lower extremities. Abnormal movements:there is LUE rest tremor that increases with distraction Coordination:  There is no decremation with RAM's, with any form of RAMS, including alternating supination and pronation of the forearm, hand opening and closing, finger taps, heel taps and toe taps. Gait and Station: The patient has no difficulty arising out of a deep-seated chair without the use of the hands. The patient's stride length is good.    I have reviewed and interpreted the following labs independently    Chemistry      Component Value Date/Time   NA 137 09/27/2021 1121   K 4.1 09/27/2021 1121   CL 97 09/27/2021 1121   CO2 33 (H) 09/27/2021 1121   BUN 17 09/27/2021 1121   CREATININE 0.99 09/27/2021 1121   CREATININE 1.03 09/10/2020 1400      Component Value Date/Time   CALCIUM 9.7 09/27/2021 1121   ALKPHOS 62 09/27/2021 1121   AST 27 09/27/2021 1121   ALT 19 09/27/2021 1121   BILITOT 1.1 09/27/2021 1121       Lab Results  Component Value Date   WBC 6.1 09/27/2021   HGB 15.1 09/27/2021   HCT 44.4 09/27/2021   MCV 96.7 09/27/2021   PLT 175.0 09/27/2021    Lab Results  Component Value Date   TSH 1.41 02/16/2021   Total time spent on today's visit was 41 minutes, including both face-to-face time and nonface-to-face time.  Time included that spent on review of records (prior notes available to me/labs/imaging if pertinent), discussing treatment and goals, answering patient's questions and coordinating care.   Cc:  Ann Held, DO

## 2022-02-10 ENCOUNTER — Other Ambulatory Visit (HOSPITAL_COMMUNITY): Payer: Self-pay

## 2022-02-11 ENCOUNTER — Other Ambulatory Visit (HOSPITAL_COMMUNITY): Payer: Self-pay

## 2022-02-14 ENCOUNTER — Encounter: Payer: Self-pay | Admitting: Neurology

## 2022-02-14 ENCOUNTER — Ambulatory Visit: Payer: Medicare HMO | Admitting: Neurology

## 2022-02-14 VITALS — BP 112/60 | HR 74 | Resp 18 | Ht 66.0 in | Wt 147.0 lb

## 2022-02-14 DIAGNOSIS — R251 Tremor, unspecified: Secondary | ICD-10-CM

## 2022-02-14 MED ORDER — PROPRANOLOL HCL 10 MG PO TABS: 10.0000 mg | ORAL_TABLET | Freq: Three times a day (TID) | ORAL | 1 refills | Status: DC

## 2022-02-14 NOTE — Patient Instructions (Signed)
I will talk with Dr. Etter Sjogren about your BP medication and will email you after I hear from her.  Stop your carbidopa/levodopa for now.  Call me/email me with any issues.

## 2022-02-23 ENCOUNTER — Ambulatory Visit: Payer: Medicare HMO | Admitting: Neurology

## 2022-03-02 ENCOUNTER — Encounter: Payer: Self-pay | Admitting: Family Medicine

## 2022-03-07 ENCOUNTER — Encounter: Payer: Self-pay | Admitting: Family Medicine

## 2022-03-27 ENCOUNTER — Ambulatory Visit (INDEPENDENT_AMBULATORY_CARE_PROVIDER_SITE_OTHER): Payer: Medicare HMO | Admitting: Family Medicine

## 2022-03-27 ENCOUNTER — Encounter: Payer: Self-pay | Admitting: Family Medicine

## 2022-03-27 VITALS — BP 120/80 | HR 54 | Temp 97.7°F | Resp 18 | Ht 66.0 in | Wt 147.2 lb

## 2022-03-27 DIAGNOSIS — R251 Tremor, unspecified: Secondary | ICD-10-CM

## 2022-03-27 DIAGNOSIS — M109 Gout, unspecified: Secondary | ICD-10-CM

## 2022-03-27 DIAGNOSIS — E785 Hyperlipidemia, unspecified: Secondary | ICD-10-CM

## 2022-03-27 DIAGNOSIS — I10 Essential (primary) hypertension: Secondary | ICD-10-CM

## 2022-03-27 LAB — COMPREHENSIVE METABOLIC PANEL
ALT: 15 U/L (ref 0–53)
AST: 21 U/L (ref 0–37)
Albumin: 4.2 g/dL (ref 3.5–5.2)
Alkaline Phosphatase: 53 U/L (ref 39–117)
BUN: 15 mg/dL (ref 6–23)
CO2: 32 mEq/L (ref 19–32)
Calcium: 9.3 mg/dL (ref 8.4–10.5)
Chloride: 101 mEq/L (ref 96–112)
Creatinine, Ser: 1 mg/dL (ref 0.40–1.50)
GFR: 78.14 mL/min (ref >60.00)
Glucose, Bld: 88 mg/dL (ref 70–99)
Potassium: 3.7 mEq/L (ref 3.5–5.1)
Sodium: 140 mEq/L (ref 135–145)
Total Bilirubin: 1.5 mg/dL — ABNORMAL HIGH (ref 0.2–1.2)
Total Protein: 6.8 g/dL (ref 6.0–8.3)

## 2022-03-27 LAB — LIPID PANEL
Cholesterol: 150 mg/dL (ref 0–200)
HDL: 61.3 mg/dL (ref >39.00)
LDL Cholesterol: 68 mg/dL (ref 0–99)
NonHDL: 88.32
Total CHOL/HDL Ratio: 2
Triglycerides: 104 mg/dL (ref 0.0–149.0)
VLDL: 20.8 mg/dL (ref 0.0–40.0)

## 2022-03-27 LAB — CBC WITH DIFFERENTIAL/PLATELET
Basophils Absolute: 0 10*3/uL (ref 0.0–0.1)
Basophils Relative: 0.9 % (ref 0.0–3.0)
Eosinophils Absolute: 0.3 10*3/uL (ref 0.0–0.7)
Eosinophils Relative: 4.7 % (ref 0.0–5.0)
HCT: 43.1 % (ref 39.0–52.0)
Hemoglobin: 14.6 g/dL (ref 13.0–17.0)
Lymphocytes Relative: 39.1 % (ref 12.0–46.0)
Lymphs Abs: 2.2 10*3/uL (ref 0.7–4.0)
MCHC: 33.8 g/dL (ref 30.0–36.0)
MCV: 97.3 fl (ref 78.0–100.0)
Monocytes Absolute: 0.5 10*3/uL (ref 0.1–1.0)
Monocytes Relative: 8.8 % (ref 3.0–12.0)
Neutro Abs: 2.6 10*3/uL (ref 1.4–7.7)
Neutrophils Relative %: 46.5 % (ref 43.0–77.0)
Platelets: 154 10*3/uL (ref 150.0–400.0)
RBC: 4.42 Mil/uL (ref 4.22–5.81)
RDW: 13.8 % (ref 11.5–15.5)
WBC: 5.5 10*3/uL (ref 4.0–10.5)

## 2022-03-27 LAB — URIC ACID: Uric Acid, Serum: 4.5 mg/dL (ref 4.0–7.8)

## 2022-03-27 MED ORDER — LISINOPRIL 10 MG PO TABS: 10.0000 mg | ORAL_TABLET | Freq: Every day | ORAL | 3 refills | Status: DC

## 2022-03-27 MED ORDER — PROPRANOLOL HCL 20 MG PO TABS: 20.0000 mg | ORAL_TABLET | Freq: Three times a day (TID) | ORAL | 3 refills | Status: DC

## 2022-03-27 NOTE — Patient Instructions (Signed)
Tremor A tremor is trembling or shaking that you cannot control. Most tremors affect the hands or arms. Tremors can also affect the head, vocal cords, face, and other parts of the body. There are many types of tremors. Common types include: Essential tremor. These usually occur in people older than 40. This type of tremor may run in families and can happen in otherwise healthy people. Resting tremor. These occur when the muscles are at rest, such as when your hands are resting in your lap. People with Parkinson's disease often have resting tremors. Postural tremor. These occur when you try to hold a pose, such as keeping your hands outstretched. Kinetic tremor. These occur during purposeful movement, such as trying to touch a finger to your nose. Task-specific tremor. These may occur when you do certain tasks such as writing, speaking, or standing. Psychogenic tremor. These are greatly reduced or go away when you are distracted. These tremors happen due to underlying stress or psychiatric disease. They can happen in people of all ages. Some types of tremors have no known cause. Tremors can also be a symptom of nervous system problems (neurological disorders) that may occur with aging. Some tremors go away with treatment, while others do not. Follow these instructions at home: Lifestyle     If you drink alcohol: Limit how much you have to: 0-1 drink a day for women who are not pregnant. 0-2 drinks a day for men. Know how much alcohol is in a drink. In the [place] one drink equals one 12 oz bottle of beer (355 mL), one 5 oz glass of wine (148 mL), or one 1 oz glass of hard liquor (44 mL). Do not use any products that contain nicotine or tobacco. These products include cigarettes, chewing tobacco, and vaping devices, such as e-cigarettes. If you need help quitting, ask your health care provider. Avoid extreme heat and extreme cold. Limit your caffeine intake, as told by your health care  provider. Try to get 8 hours of sleep each night. Find ways to manage your stress, such as meditation or yoga. General instructions Take over-the-counter and prescription medicines only as told by your health care provider. Keep all follow-up visits. This is important. Contact a health care provider if: You develop a tremor after starting a new medicine. You have a tremor along with other symptoms such as: Numbness. Tingling. Pain. Weakness. Your tremor gets worse. Your tremor interferes with your day-to-day life. Summary A tremor is trembling or shaking that you cannot control. Most tremors affect the hands or arms. Some types of tremors have no known cause. Others may be a symptom of nervous system problems (neurological disorders). Make sure you discuss any tremors you have with your health care provider. This information is not intended to replace advice given to you by your health care provider. Make sure you discuss any questions you have with your health care provider. Document Revised: 06/03/2021 Document Reviewed: 06/03/2021 Elsevier Patient Education  2023 [employer]  

## 2022-03-27 NOTE — Assessment & Plan Note (Signed)
Encourage heart healthy diet such as MIND or DASH diet, increase exercise, avoid trans fats, simple carbohydrates and processed foods, consider a krill or fish or flaxseed oil cap daily.  °

## 2022-03-27 NOTE — Assessment & Plan Note (Signed)
Well controlled, no changes to meds. Encouraged heart healthy diet such as the DASH diet and exercise as tolerated.  °

## 2022-03-27 NOTE — Assessment & Plan Note (Signed)
Stable Cont meds 

## 2022-03-27 NOTE — Progress Notes (Addendum)
Subjective:   By signing my name below, I, Thomas Levy, attest that this documentation has been prepared under the direction and in the presence of Ann Held DO 03/27/2022   Patient ID: Thomas Levy, male    DOB: 01/23/1956, 66 y.o.   MRN: [id number]  Chief Complaint  Patient presents with   Hypertension   Hyperlipidemia   Follow-up    HPI Patient is in today for an office visit   pt here to f/u bp and cholesterol.    Past Medical History:  Diagnosis Date   Hyperlipidemia    Hypertension     Past Surgical History:  Procedure Laterality Date   HERNIA REPAIR     VASECTOMY      Family History  Problem Relation Age of Onset   Hypertension Mother    Cancer Mother        breast   Kidney disease Father        kidney disease-- on dialysis   Hypertension Brother    Hyperlipidemia Brother    Thyroid cancer Child     Social History   Socioeconomic History   Marital status: Married    Spouse name: Not on file   Number of children: Not on file   Years of education: Not on file   Highest education level: Not on file  Occupational History   Not on file  Tobacco Use   Smoking status: Former    Types: Cigars    Start date: 08/29/2008   Smokeless tobacco: Never   Tobacco comments:    Quit smoking cigaretts 5 years ago    Smoked 1 cig a day x 30 years   Substance and Sexual Activity   Alcohol use: Yes    Alcohol/week: 7.0 standard drinks of alcohol    Types: 7 Glasses of wine per week   Drug use: No   Sexual activity: Yes    Partners: Female  Other Topics Concern   Not on file  Social History Narrative   Gym-- 4 days a week for 1 hour      Right Handed   Lives in a one story home    Drinks no Caffeine    Social Determinants of Health   Financial Resource Strain: Low Risk  (12/23/2021)   Overall Financial Resource Strain (CARDIA)    Difficulty of Paying Living Expenses: Not hard at all  Food Insecurity: No Food Insecurity (12/23/2021)   Hunger  Vital Sign    Worried About Running Out of Food in the Last Year: Never true    Ran Out of Food in the Last Year: Never true  Transportation Needs: No Transportation Needs (12/23/2021)   PRAPARE - Hydrologist (Medical): No    Lack of Transportation (Non-Medical): No  Physical Activity: Sufficiently Active (12/23/2021)   Exercise Vital Sign    Days of Exercise per Week: 7 days    Minutes of Exercise per Session: 60 min  Stress: No Stress Concern Present (12/23/2021)   [place]    Feeling of Stress : Not at all  Social Connections: Moderately Isolated (12/23/2021)   Social Connection and Isolation Panel [NHANES]    Frequency of Communication with Friends and Family: More than three times a week    Frequency of Social Gatherings with Friends and Family: Once a week    Attends Religious Services: Never    Marine scientist or  Organizations: No    Attends Archivist Meetings: Never    Marital Status: Married  Human resources officer Violence: Not At Risk (12/23/2021)   Humiliation, Afraid, Rape, and Kick questionnaire    Fear of Current or Ex-Partner: No    Emotionally Abused: No    Physically Abused: No    Sexually Abused: No    Outpatient Medications Prior to Visit  Medication Sig Dispense Refill   albuterol (VENTOLIN HFA) 108 (90 Base) MCG/ACT inhaler Inhale 2 puffs into the lungs every 6 (six) hours as needed for wheezing or shortness of breath. 18 g 5   allopurinol (ZYLOPRIM) 300 MG tablet Take 1 tablet (300 mg total) by mouth daily. 90 tablet 1   ALPRAZolam (XANAX) 0.5 MG tablet Take 1 tablet (0.5 mg total) by mouth at bedtime as needed for anxiety. 30 tablet 0   atorvastatin (LIPITOR) 20 MG tablet TAKE 1 TABLET BY MOUTH EVERYDAY AT BEDTIME 90 tablet 1   clotrimazole-betamethasone (LOTRISONE) lotion Apply topically 2 (two) times daily. x's 14 days 30 mL 0   nystatin  (MYCOSTATIN/NYSTOP) powder Apply topically 4 (four) times daily. 15 g 0   propranolol (INDERAL) 10 MG tablet Take 1 tablet (10 mg total) by mouth 3 (three) times daily. 270 tablet 1   sildenafil (REVATIO) 20 MG tablet Take 1-5 tablets by mouth one hour prior to intercourse as needed 100 tablet 2   lisinopril-hydrochlorothiazide (ZESTORETIC) 20-12.5 MG tablet TAKE 2 TABLETS BY MOUTH EVERY DAY (Patient taking differently: Take 1 tablet by mouth daily.) 180 tablet 1   No facility-administered medications prior to visit.    No Known Allergies  Review of Systems  Constitutional:  Negative for fever and malaise/fatigue.  HENT:  Negative for congestion.   Eyes:  Negative for blurred vision.  Respiratory:  Negative for cough and shortness of breath.   Cardiovascular:  Negative for chest pain, palpitations and leg swelling.  Gastrointestinal:  Negative for vomiting.  Musculoskeletal:  Negative for back pain.  Skin:  Negative for rash.  Neurological:  Negative for loss of consciousness and headaches.       Objective:    Physical Exam Vitals and nursing note reviewed.  Constitutional:      General: He is not in acute distress.    Appearance: Normal appearance. He is not ill-appearing.  HENT:     Head: Normocephalic and atraumatic.     Right Ear: External ear normal.     Left Ear: External ear normal.  Eyes:     Extraocular Movements: Extraocular movements intact.     Pupils: Pupils are equal, round, and reactive to light.  Cardiovascular:     Rate and Rhythm: Normal rate and regular rhythm.     Heart sounds: Normal heart sounds. No murmur heard.    No gallop.  Pulmonary:     Effort: Pulmonary effort is normal. No respiratory distress.     Breath sounds: Normal breath sounds. No wheezing or rales.  Skin:    General: Skin is warm and dry.  Neurological:     Mental Status: He is alert and oriented to person, place, and time.  Psychiatric:        Judgment: Judgment normal.      BP 120/80 (BP Location: Left Arm, Patient Position: Sitting, Cuff Size: Normal)   Pulse (!) 54   Temp 97.7 F (36.5 C) (Oral)   Resp 18   Ht '5\' 6"'$  (1.676 m)   Wt 147 lb 3.2 oz (66.8 kg)  SpO2 97%   BMI 23.76 kg/m  Wt Readings from Last 3 Encounters:  03/27/22 147 lb 3.2 oz (66.8 kg)  02/14/22 147 lb (66.7 kg)  10/21/21 146 lb (66.2 kg)    Diabetic Foot Exam - Simple   No data filed    Lab Results  Component Value Date   WBC 6.1 09/27/2021   HGB 15.1 09/27/2021   HCT 44.4 09/27/2021   PLT 175.0 09/27/2021   GLUCOSE 94 09/27/2021   CHOL 174 09/27/2021   TRIG 79.0 09/27/2021   HDL 71.30 09/27/2021   LDLCALC 87 09/27/2021   ALT 19 09/27/2021   AST 27 09/27/2021   NA 137 09/27/2021   K 4.1 09/27/2021   CL 97 09/27/2021   CREATININE 0.99 09/27/2021   BUN 17 09/27/2021   CO2 33 (H) 09/27/2021   TSH 1.41 02/16/2021   PSA 1.00 09/27/2021    Lab Results  Component Value Date   TSH 1.41 02/16/2021   Lab Results  Component Value Date   WBC 6.1 09/27/2021   HGB 15.1 09/27/2021   HCT 44.4 09/27/2021   MCV 96.7 09/27/2021   PLT 175.0 09/27/2021   Lab Results  Component Value Date   NA 137 09/27/2021   K 4.1 09/27/2021   CO2 33 (H) 09/27/2021   GLUCOSE 94 09/27/2021   BUN 17 09/27/2021   CREATININE 0.99 09/27/2021   BILITOT 1.1 09/27/2021   ALKPHOS 62 09/27/2021   AST 27 09/27/2021   ALT 19 09/27/2021   PROT 7.2 09/27/2021   ALBUMIN 4.5 09/27/2021   CALCIUM 9.7 09/27/2021   GFR 79.36 09/27/2021   Lab Results  Component Value Date   CHOL 174 09/27/2021   Lab Results  Component Value Date   HDL 71.30 09/27/2021   Lab Results  Component Value Date   LDLCALC 87 09/27/2021   Lab Results  Component Value Date   TRIG 79.0 09/27/2021   Lab Results  Component Value Date   CHOLHDL 2 09/27/2021   No results found for: "HGBA1C"     Assessment & Plan:   Problem List Items Addressed This Visit       Unprioritized   Tremor of left hand    Relevant Medications   propranolol (INDERAL) 20 MG tablet   Hyperlipidemia LDL goal <100    Encourage heart healthy diet such as MIND or DASH diet, increase exercise, avoid trans fats, simple carbohydrates and processed foods, consider a krill or fish or flaxseed oil cap daily.       Relevant Medications   lisinopril (ZESTRIL) 10 MG tablet   propranolol (INDERAL) 20 MG tablet   Gout - Primary    Stable  Cont meds       Relevant Orders   CBC with Differential/Platelet   Comprehensive metabolic panel   Lipid panel   Uric acid   Essential hypertension    Well controlled, no changes to meds. Encouraged heart healthy diet such as the DASH diet and exercise as tolerated.       Relevant Medications   lisinopril (ZESTRIL) 10 MG tablet   propranolol (INDERAL) 20 MG tablet   Other Visit Diagnoses     Hyperlipidemia, unspecified hyperlipidemia type       Relevant Medications   lisinopril (ZESTRIL) 10 MG tablet   propranolol (INDERAL) 20 MG tablet   Other Relevant Orders   CBC with Differential/Platelet   Comprehensive metabolic panel   Lipid panel   Primary hypertension  Relevant Medications   lisinopril (ZESTRIL) 10 MG tablet   propranolol (INDERAL) 20 MG tablet   Other Relevant Orders   CBC with Differential/Platelet   Comprehensive metabolic panel   Lipid panel   Uric acid       Meds ordered this encounter  Medications   lisinopril (ZESTRIL) 10 MG tablet    Sig: Take 1 tablet (10 mg total) by mouth daily.    Dispense:  90 tablet    Refill:  3   propranolol (INDERAL) 20 MG tablet    Sig: Take 1 tablet (20 mg total) by mouth 3 (three) times daily.    Dispense:  90 tablet    Refill:  3    I, Ann Held, DO, personally preformed the services described in this documentation.  All medical record entries made by the scribe were at my direction and in my presence.  I have reviewed the chart and discharge instructions (if applicable) and agree that the  record reflects my personal performance and is accurate and complete. 03/27/2022   I,Amber Collins,acting as a scribe for [employer], DO.,have documented all relevant documentation on the behalf of Ann Held, DO,as directed by  Ann Held, DO while in the presence of Ann Held, DO.    Ann Held, DO

## 2022-04-20 ENCOUNTER — Encounter: Payer: Self-pay | Admitting: Family Medicine

## 2022-04-21 ENCOUNTER — Other Ambulatory Visit: Payer: Self-pay | Admitting: Family Medicine

## 2022-04-21 DIAGNOSIS — E785 Hyperlipidemia, unspecified: Secondary | ICD-10-CM

## 2022-04-21 MED ORDER — PRIMIDONE 125 MG PO TABS: 1.0000 | ORAL_TABLET | Freq: Every day | ORAL | 2 refills | Status: DC

## 2022-04-24 MED ORDER — LISINOPRIL-HYDROCHLOROTHIAZIDE 20-12.5 MG PO TABS: 2.0000 | ORAL_TABLET | Freq: Every day | ORAL | 0 refills | Status: DC

## 2022-04-24 NOTE — Addendum Note (Signed)
Addended byDamita Dunnings D on: 04/24/2022 09:35 AM   Modules accepted: Orders

## 2022-05-02 ENCOUNTER — Ambulatory Visit: Payer: Medicare HMO | Admitting: Neurology

## 2022-05-15 ENCOUNTER — Encounter: Payer: Self-pay | Admitting: Family Medicine

## 2022-05-19 ENCOUNTER — Encounter: Payer: Self-pay | Admitting: Family Medicine

## 2022-05-23 ENCOUNTER — Ambulatory Visit (INDEPENDENT_AMBULATORY_CARE_PROVIDER_SITE_OTHER): Payer: Medicare HMO | Admitting: Family Medicine

## 2022-05-23 ENCOUNTER — Other Ambulatory Visit (HOSPITAL_COMMUNITY): Payer: Self-pay

## 2022-05-23 VITALS — BP 108/70 | HR 62 | Temp 97.8°F | Ht 66.0 in | Wt 143.6 lb

## 2022-05-23 DIAGNOSIS — R251 Tremor, unspecified: Secondary | ICD-10-CM

## 2022-05-23 DIAGNOSIS — N529 Male erectile dysfunction, unspecified: Secondary | ICD-10-CM

## 2022-05-23 DIAGNOSIS — R0683 Snoring: Secondary | ICD-10-CM

## 2022-05-23 DIAGNOSIS — I10 Essential (primary) hypertension: Secondary | ICD-10-CM

## 2022-05-23 MED ORDER — PRIMIDONE 50 MG PO TABS: 50.0000 mg | ORAL_TABLET | Freq: Three times a day (TID) | ORAL | 1 refills | Status: DC

## 2022-05-23 MED ORDER — LISINOPRIL-HYDROCHLOROTHIAZIDE 20-12.5 MG PO TABS: 2.0000 | ORAL_TABLET | Freq: Every day | ORAL | 1 refills | Status: DC

## 2022-05-23 MED ORDER — SILDENAFIL CITRATE 20 MG PO TABS
ORAL_TABLET | ORAL | 2 refills | Status: DC
  Filled 2022-05-23: qty 100, 20d supply, fill #0
  Filled 2022-12-25: qty 100, 20d supply, fill #1

## 2022-05-23 MED ORDER — SILDENAFIL CITRATE 20 MG PO TABS: ORAL_TABLET | ORAL | 2 refills | Status: DC

## 2022-05-23 NOTE — Progress Notes (Unsigned)
   Established Patient Office Visit  Subjective   Patient ID: Thomas Levy, male    DOB: 02/04/1956  Age: 66 y.o. MRN: [id number]  Chief Complaint  Patient presents with  . Sleep issue    Pt states snoring and waking up in the middle of the night    HPI Pt wife has been c/o his snoring and he is getting up 2-4 x a night to either use restroom   pt is tired during the day.   pt  {History (Optional):23778}  Review of Systems  Constitutional:  Negative for fever and malaise/fatigue.  HENT:  Negative for congestion.   Eyes:  Negative for blurred vision.  Respiratory:  Negative for shortness of breath.   Cardiovascular:  Negative for chest pain, palpitations and leg swelling.  Gastrointestinal:  Negative for abdominal pain, blood in stool and nausea.  Genitourinary:  Negative for dysuria and frequency.  Musculoskeletal:  Negative for falls.  Skin:  Negative for rash.  Neurological:  Positive for tremors. Negative for dizziness, loss of consciousness and headaches.  Endo/Heme/Allergies:  Negative for environmental allergies.  Psychiatric/Behavioral:  Negative for depression. The patient is not nervous/anxious.      Objective:     BP 108/70 (BP Location: Left Arm, Patient Position: Sitting, Cuff Size: Normal)   Pulse 62   Temp 97.8 F (36.6 C) (Oral)   Ht '5\' 6"'$  (1.676 m)   Wt 143 lb 9.6 oz (65.1 kg)   SpO2 96%   BMI 23.18 kg/m  {Vitals History (Optional):23777}  Physical Exam   No results found for any visits on 05/23/22.  {Labs (Optional):23779}  The 10-year ASCVD risk score (Arnett DK, et al., 2019) is: 9.7%    Assessment & Plan:   Problem List Items Addressed This Visit   None Visit Diagnoses     Primary hypertension    -  Primary   Relevant Medications   lisinopril-hydrochlorothiazide (ZESTORETIC) 20-12.5 MG tablet       No follow-ups on file.    Ann Held, DO

## 2022-05-23 NOTE — Patient Instructions (Signed)
Living With Sleep Apnea Sleep apnea is a condition in which breathing pauses or becomes shallow during sleep. Sleep apnea is most commonly caused by a collapsed or blocked airway. People with sleep apnea usually snore loudly. They may have times when they gasp and stop breathing for 10 seconds or more during sleep. This may happen many times during the night. The breaks in breathing also interrupt the deep sleep that you need to feel rested. Even if you do not completely wake up from the gaps in breathing, your sleep may not be restful and you feel tired during the day. You may also have a headache in the morning and low energy during the day, and you may feel anxious or depressed. How can sleep apnea affect me? Sleep apnea increases your chances of extreme tiredness during the day (daytime fatigue). It can also increase your risk for health conditions, such as: Heart attack. Stroke. Obesity. Type 2 diabetes. Heart failure. Irregular heartbeat. High blood pressure. If you have daytime fatigue as a result of sleep apnea, you may be more likely to: Perform poorly at school or work. Fall asleep while driving. Have difficulty with attention. Develop depression or anxiety. Have sexual dysfunction. What actions can I take to manage sleep apnea? Sleep apnea treatment  If you were given a device to open your airway while you sleep, use it only as told by your health care provider. You may be given: An oral appliance. This is a custom-made mouthpiece that shifts your lower jaw forward. A continuous positive airway pressure (CPAP) device. This device blows air through a mask when you breathe out (exhale). A nasal expiratory positive airway pressure (EPAP) device. This device has valves that you put into each nostril. A bi-level positive airway pressure (BIPAP) device. This device blows air through a mask when you breathe in (inhale) and breathe out (exhale). You may need surgery if other treatments  do not work for you. Sleep habits Go to sleep and wake up at the same time every day. This helps set your internal clock (circadian rhythm) for sleeping. If you stay up later than usual, such as on weekends, try to get up in the morning within 2 hours of your normal wake time. Try to get at least 7-9 hours of sleep each night. Stop using a computer, tablet, and mobile phone a few hours before bedtime. Do not take long naps during the day. If you nap, limit it to 30 minutes. Have a relaxing bedtime routine. Reading or listening to music may relax you and help you sleep. Use your bedroom only for sleep. Keep your television and computer out of your bedroom. Keep your bedroom cool, dark, and quiet. Use a supportive mattress and pillows. Follow your health care provider's instructions for other changes to sleep habits. Nutrition Do not eat heavy meals in the evening. Do not have caffeine in the later part of the day. The effects of caffeine can last for more than 5 hours. Follow your health care provider's or dietitian's instructions for any diet changes. Lifestyle     Do not drink alcohol before bedtime. Alcohol can cause you to fall asleep at first, but then it can cause you to wake up in the middle of the night and have trouble getting back to sleep. Do not use any products that contain nicotine or tobacco. These products include cigarettes, chewing tobacco, and vaping devices, such as e-cigarettes. If you need help quitting, ask your health care provider. Medicines Take   over-the-counter and prescription medicines only as told by your health care provider. Do not use over-the-counter sleep medicine. You can become dependent on this medicine, and it can make sleep apnea worse. Do not use medicines, such as sedatives and narcotics, unless told by your health care provider. Activity Exercise on most days, but avoid exercising in the evening. Exercising near bedtime can interfere with  sleeping. If possible, spend time outside every day. Natural light helps regulate your circadian rhythm. General information Lose weight if you need to, and maintain a healthy weight. Keep all follow-up visits. This is important. If you are having surgery, make sure to tell your health care provider that you have sleep apnea. You may need to bring your device with you. Where to find more information Learn more about sleep apnea and daytime fatigue from: American Sleep Association: sleepassociation.org National Sleep Foundation: sleepfoundation.org National Heart, Lung, and Blood Institute: nhlbi.nih.gov Summary Sleep apnea is a condition in which breathing pauses or becomes shallow during sleep. Sleep apnea can cause daytime fatigue and other serious health conditions. You may need to wear a device while sleeping to help keep your airway open. If you are having surgery, make sure to tell your health care provider that you have sleep apnea. You may need to bring your device with you. Making changes to sleep habits, diet, lifestyle, and activity can help you manage sleep apnea. This information is not intended to replace advice given to you by your health care provider. Make sure you discuss any questions you have with your health care provider. Document Revised: 03/23/2021 Document Reviewed: 07/23/2020 Elsevier Patient Education  2023 Elsevier Inc.  

## 2022-05-24 ENCOUNTER — Other Ambulatory Visit (HOSPITAL_COMMUNITY): Payer: Self-pay

## 2022-05-24 ENCOUNTER — Encounter: Payer: Self-pay | Admitting: Family Medicine

## 2022-06-08 ENCOUNTER — Ambulatory Visit (INDEPENDENT_AMBULATORY_CARE_PROVIDER_SITE_OTHER): Payer: Medicare HMO | Admitting: Pulmonary Disease

## 2022-06-08 ENCOUNTER — Encounter: Payer: Self-pay | Admitting: Pulmonary Disease

## 2022-06-08 VITALS — BP 120/78 | HR 76 | Temp 97.8°F | Ht 66.0 in | Wt 145.0 lb

## 2022-06-08 DIAGNOSIS — R0683 Snoring: Secondary | ICD-10-CM | POA: Diagnosis not present

## 2022-06-08 MED ORDER — ZOLPIDEM TARTRATE 10 MG PO TABS: 10.0000 mg | ORAL_TABLET | Freq: Every evening | ORAL | 3 refills | Status: AC | PRN

## 2022-06-08 NOTE — Telephone Encounter (Signed)
Dr. Ander Slade, please advise on pt's message. Unsure which sleep study he is referring to.

## 2022-06-08 NOTE — Progress Notes (Signed)
Thomas Levy    [id number]    Oct 31, 1955  Primary Care Physician:Lowne Cheri Rous Alferd Apa, DO  Referring Physician: Carollee Herter, Alferd Apa, DO [address] [place],  [place] [address]  Chief complaint:   Sleep onset and sleep maintenance insomnia History of snoring  HPI:  Patient with a history of snoring No significant dryness of her mouth in the morning No morning headaches No nighttime palpitations No choking episodes at night Admits to daytime sleepiness  Usually goes to bed about 12 AM-1 AM May take about 30 minutes 1 hour to fall asleep 2-4 awakenings Final wake up time between 8 and 9 AM  Weight is up about 5 to 10 pounds  Exercises daily about 1 to 2 hours  Has history of hypertension  History of benign essential tremors  Unaware that parents had sleep issues  Retired Pharmacist, community  Quit smoking about 5 years ago  1.5- 3 ounces scotch per day  Outpatient Encounter Medications as of 06/08/2022  Medication Sig   albuterol (VENTOLIN HFA) 108 (90 Base) MCG/ACT inhaler Inhale 2 puffs into the lungs every 6 (six) hours as needed for wheezing or shortness of breath.   allopurinol (ZYLOPRIM) 300 MG tablet Take 1 tablet (300 mg total) by mouth daily.   ALPRAZolam (XANAX) 0.5 MG tablet Take 1 tablet (0.5 mg total) by mouth at bedtime as needed for anxiety.   atorvastatin (LIPITOR) 20 MG tablet Take 1 tablet (20 mg total) by mouth at bedtime.   lisinopril-hydrochlorothiazide (ZESTORETIC) 20-12.5 MG tablet Take 2 tablets by mouth daily.   nystatin (MYCOSTATIN/NYSTOP) powder Apply topically 4 (four) times daily.   primidone (MYSOLINE) 50 MG tablet Take 1 tablet (50 mg total) by mouth 3 (three) times daily.   sildenafil (REVATIO) 20 MG tablet Take 1-5 tablets by mouth one hour prior to intercourse as needed   [DISCONTINUED] clotrimazole-betamethasone (LOTRISONE) lotion Apply topically 2 (two) times daily. x's 14 days   No facility-administered  encounter medications on file as of 06/08/2022.    Allergies as of 06/08/2022   (No Known Allergies)    Past Medical History:  Diagnosis Date   Hyperlipidemia    Hypertension     Past Surgical History:  Procedure Laterality Date   HERNIA REPAIR     VASECTOMY      Family History  Problem Relation Age of Onset   Hypertension Mother    Cancer Mother        breast   Kidney disease Father        kidney disease-- on dialysis   Hypertension Brother    Hyperlipidemia Brother    Thyroid cancer Child     Social History   Socioeconomic History   Marital status: Married    Spouse name: Not on file   Number of children: Not on file   Years of education: Not on file   Highest education level: Not on file  Occupational History   Not on file  Tobacco Use   Smoking status: Former    Types: Cigars    Start date: 08/29/2008   Smokeless tobacco: Never   Tobacco comments:    Quit smoking cigaretts 5 years ago    Smoked 1 cig a day x 30 years   Substance and Sexual Activity   Alcohol use: Yes    Alcohol/week: 7.0 standard drinks of alcohol    Types: 7 Glasses of wine per week   Drug  use: No   Sexual activity: Yes    Partners: Female  Other Topics Concern   Not on file  Social History Narrative   Gym-- 4 days a week for 1 hour      Right Handed   Lives in a one story home    Drinks no Caffeine    Social Determinants of Health   Financial Resource Strain: Low Risk  (12/23/2021)   Overall Financial Resource Strain (CARDIA)    Difficulty of Paying Living Expenses: Not hard at all  Food Insecurity: No Food Insecurity (12/23/2021)   Hunger Vital Sign    Worried About Running Out of Food in the Last Year: Never true    Ran Out of Food in the Last Year: Never true  Transportation Needs: No Transportation Needs (12/23/2021)   PRAPARE - Hydrologist (Medical): No    Lack of Transportation (Non-Medical): No  Physical Activity: Sufficiently Active  (12/23/2021)   Exercise Vital Sign    Days of Exercise per Week: 7 days    Minutes of Exercise per Session: 60 min  Stress: No Stress Concern Present (12/23/2021)   [place]    Feeling of Stress : Not at all  Social Connections: Moderately Isolated (12/23/2021)   Social Connection and Isolation Panel [NHANES]    Frequency of Communication with Friends and Family: More than three times a week    Frequency of Social Gatherings with Friends and Family: Once a week    Attends Religious Services: Never    Marine scientist or Organizations: No    Attends Archivist Meetings: Never    Marital Status: Married  Human resources officer Violence: Not At Risk (12/23/2021)   Humiliation, Afraid, Rape, and Kick questionnaire    Fear of Current or Ex-Partner: No    Emotionally Abused: No    Physically Abused: No    Sexually Abused: No    Review of Systems  Psychiatric/Behavioral:  Positive for sleep disturbance.     There were no vitals filed for this visit.   Physical Exam Constitutional:      Appearance: Normal appearance.  HENT:     Mouth/Throat:     Mouth: Mucous membranes are moist.  Cardiovascular:     Rate and Rhythm: Normal rate and regular rhythm.     Heart sounds: No murmur heard.    No friction rub.  Pulmonary:     Effort: No respiratory distress.     Breath sounds: No stridor. No wheezing or rhonchi.  Musculoskeletal:     Cervical back: No rigidity or tenderness.  Neurological:     Mental Status: He is alert.  Psychiatric:        Mood and Affect: Mood normal.       06/08/2022    8:00 AM  Results of the Epworth flowsheet  Sitting and reading 2  Watching TV 2  Sitting, inactive in a public place (e.g. a theatre or a meeting) 2  As a passenger in a car for an hour without a break 1  Lying down to rest in the afternoon when circumstances permit 3  Sitting and talking to someone 1  Sitting  quietly after a lunch without alcohol 2  In a car, while stopped for a few minutes in traffic 1  Total score 14    Data Reviewed: Recent CT and PET scan reviewed  Assessment:  Sleep onset and sleep maintenance  insomnia -Habits that may contribute to sleep onset and sleep maintenance insomnia were discussed with the patient -Behavioral modifications that can help his sleep discussed -Strict sleep hygiene discussed  Snoring -He does have some concern for sleep disordered breathing -With his body habitus, the likelihood is not high that he has significant sleep apnea -He does feel it will be beneficial if he is sure that he does not have significant sleep apnea that can be treated  Plan/Recommendations: Prescription for Ambien sent to pharmacy  Behavioral modifications to help quality of sleep encouraged  Encouraged to stay active and exercise regularly  Schedule for home sleep study  I will see him back in about 4 months  Encouraged to call with significant concerns   Sherrilyn Rist MD [facility] 06/08/2022, 8:55 AM  CC: Ann Held, *

## 2022-06-08 NOTE — Patient Instructions (Signed)
I will send you a prescription for Ambien to help with sleep onset and sleep maintenance  I will order a home sleep test to rule out significant sleep apnea-it is taking her some time to get this done  Behavioral modifications to help your sleep quality  Continue regular exercises  Reduce fluid intake into the evening  Call us with significant concerns

## 2022-06-09 ENCOUNTER — Telehealth: Payer: Self-pay | Admitting: Pulmonary Disease

## 2022-06-09 DIAGNOSIS — R0683 Snoring: Secondary | ICD-10-CM

## 2022-06-09 NOTE — Telephone Encounter (Signed)
Schedule in lab sleep study in place of home sleep study

## 2022-06-09 NOTE — Addendum Note (Signed)
Addended by: Elton Sin on: 06/09/2022 10:33 AM   Modules accepted: Orders

## 2022-06-09 NOTE — Telephone Encounter (Signed)
In lab study is the standard of care for diagnosing sleep apnea  Home sleep studies are usually accurate with the false negatives that we did discuss during the visit.  I will request that we check on the in lab study and someone will be in touch

## 2022-06-09 NOTE — Telephone Encounter (Signed)
In lab sleep study  order placed.  Left detailed message on patient's VM (DPR) letting him know sleep study order was placed and someone will call him to schedule.  Call back number given for any patient questions or concerns.

## 2022-06-16 ENCOUNTER — Telehealth: Payer: Self-pay | Admitting: Family Medicine

## 2022-06-16 NOTE — Telephone Encounter (Signed)
Pt has [employer] Medicare and last Tdap was 2013

## 2022-06-16 NOTE — Telephone Encounter (Signed)
Patient called to say he received a message that he is due for his tetanus shot. Please place order for tetanus shot and advise to get appt scheduled.

## 2022-06-19 ENCOUNTER — Encounter: Payer: Self-pay | Admitting: Family Medicine

## 2022-06-19 NOTE — Telephone Encounter (Signed)
Pt made aware

## 2022-07-09 ENCOUNTER — Encounter: Payer: Self-pay | Admitting: Family Medicine

## 2022-07-10 DIAGNOSIS — R051 Acute cough: Secondary | ICD-10-CM | POA: Diagnosis not present

## 2022-07-10 DIAGNOSIS — J069 Acute upper respiratory infection, unspecified: Secondary | ICD-10-CM | POA: Diagnosis not present

## 2022-07-10 NOTE — Telephone Encounter (Signed)
Attempted to contact patient to schedule appointment for cough.  Left message requesting call back, holding 1:40 appt slot for today 07/10/22 with PCP.

## 2022-07-18 ENCOUNTER — Encounter: Payer: Self-pay | Admitting: Pulmonary Disease

## 2022-07-22 ENCOUNTER — Other Ambulatory Visit: Payer: Self-pay | Admitting: Family Medicine

## 2022-07-22 DIAGNOSIS — M109 Gout, unspecified: Secondary | ICD-10-CM

## 2022-08-08 ENCOUNTER — Encounter: Payer: Self-pay | Admitting: Family Medicine

## 2022-08-14 DIAGNOSIS — R351 Nocturia: Secondary | ICD-10-CM | POA: Diagnosis not present

## 2022-08-14 DIAGNOSIS — R350 Frequency of micturition: Secondary | ICD-10-CM | POA: Diagnosis not present

## 2022-08-14 DIAGNOSIS — R3915 Urgency of urination: Secondary | ICD-10-CM | POA: Diagnosis not present

## 2022-08-16 ENCOUNTER — Encounter (HOSPITAL_BASED_OUTPATIENT_CLINIC_OR_DEPARTMENT_OTHER): Payer: Medicare HMO | Admitting: Pulmonary Disease

## 2022-08-29 NOTE — Progress Notes (Unsigned)
Assessment/Plan:   1.  Parkinsons Disease, mild  -Patient has now met formal criteria.  -We have trialed levodopa, but that did not help the tremor.  It doesn't in 30-50% of Parkinsons Disease paitents.   -Have discussed DaT scan's and skin biopsies.  -We decided to start pramipexole and work to 0.5 mg 3 times per day.  Discussed with him that this may not help tremor either.  Discussed with him that surgical interventions may be the only thing that helped tremor.  We did discuss risk, benefits, side effects of dopamine agonist.  -I talked to the patient about DBS and focused ultrasound, which may be options in the future for levodopa resistant tremor.  I really do not like to do this early on, however.  -Discussed importance of safe, cardiovascular exercise.       Subjective:   Thomas Levy was seen today in follow up for parkinsonian tremor.    My previous records were reviewed prior to todays visit as well as outside records available to me.  Last visit, we decided to start propranolol and hold the levodopa.  I spoke with his primary care physician and she recommended that he also decrease his lisinopril/hydrochlorothiazide down to 1 tablet/day because we were starting the propranolol.  He thought that potentially made his breathing a bit more labored.  He did see his primary care September 26 and she also started him on primidone, 250 mg.  He was taking that in the AM.  It made him too sleepy.  He changed to primidone 50 mg, 1 po tid.  It made him less sleepy but didn't help so much.    Current prescribed movement disorder medications: Propranolol, 10 mg 3 times per day (now off)   PREVIOUS MEDICATIONS: Carbidopa/levodopa 25/100, 1 tablet 3 times per day (tolerated it, but is stopped because it did not help tremor); primidone; propranolol 10 mg tid (? Affect breathing)  ALLERGIES:  No Known Allergies  CURRENT MEDICATIONS:  Current Meds  Medication Sig   albuterol (VENTOLIN  HFA) 108 (90 Base) MCG/ACT inhaler Inhale 2 puffs into the lungs every 6 (six) hours as needed for wheezing or shortness of breath.   allopurinol (ZYLOPRIM) 300 MG tablet TAKE 1 TABLET BY MOUTH EVERY DAY   ALPRAZolam (XANAX) 0.5 MG tablet Take 1 tablet (0.5 mg total) by mouth at bedtime as needed for anxiety.   atorvastatin (LIPITOR) 20 MG tablet Take 1 tablet (20 mg total) by mouth at bedtime.   lisinopril-hydrochlorothiazide (ZESTORETIC) 20-12.5 MG tablet Take 2 tablets by mouth daily.   sildenafil (REVATIO) 20 MG tablet Take 1-5 tablets by mouth one hour prior to intercourse as needed   zolpidem (AMBIEN) 10 MG tablet Take 1 tablet (10 mg total) by mouth at bedtime as needed for sleep.     Objective:   PHYSICAL EXAMINATION:    VITALS:   Vitals:   08/30/22 0812  BP: 118/80  Pulse: 66  SpO2: 96%  Weight: 143 lb 12.8 oz (65.2 kg)  Height: _0  (1.676 m)      GEN:  The patient appears stated age and is in NAD. HEENT:  Normocephalic, atraumatic.  The mucous membranes are moist. The superficial temporal arteries are without ropiness or tenderness. CV:  RRR Lungs:  CTAB Neck/HEME:  There are no carotid bruits bilaterally.  Neurological examination:  Orientation: The patient is alert and oriented x3. Cranial nerves: There is good facial symmetry without facial hypomimia. The speech is fluent  and clear. Soft palate rises symmetrically and there is no tongue deviation. Hearing is intact to conversational tone. Sensation: Sensation is intact to light touch throughout Motor: Strength is at least antigravity x4.  Movement examination: Tone: There is normal tone in the upper and lower extremities. Abnormal movements:there is LUE rest tremor that increases with distraction Coordination:  There is mild decremation with foot taps on the L Gait and Station: The patient has no difficulty arising out of a deep-seated chair without the use of the hands. The patient's stride length is good  but there is decreased arm swing on the L  I have reviewed and interpreted the following labs independently    Chemistry      Component Value Date/Time   NA 140 03/27/2022 0959   K 3.7 03/27/2022 0959   CL 101 03/27/2022 0959   CO2 32 03/27/2022 0959   BUN 15 03/27/2022 0959   CREATININE 1.00 03/27/2022 0959   CREATININE 1.03 09/10/2020 1400      Component Value Date/Time   CALCIUM 9.3 03/27/2022 0959   ALKPHOS 53 03/27/2022 0959   AST 21 03/27/2022 0959   ALT 15 03/27/2022 0959   BILITOT 1.5 (H) 03/27/2022 0959       Lab Results  Component Value Date   WBC 5.5 03/27/2022   HGB 14.6 03/27/2022   HCT 43.1 03/27/2022   MCV 97.3 03/27/2022   PLT 154.0 03/27/2022    Lab Results  Component Value Date   TSH 1.41 02/16/2021   Total time spent on today's visit was 42 minutes, including both face-to-face time and nonface-to-face time.  Time included that spent on review of records (prior notes available to me/labs/imaging if pertinent), discussing treatment and goals, answering patient's questions and coordinating care.   Cc:  Ann Held, DO

## 2022-08-30 ENCOUNTER — Ambulatory Visit: Payer: Medicare HMO | Admitting: Neurology

## 2022-08-30 ENCOUNTER — Encounter: Payer: Self-pay | Admitting: Neurology

## 2022-08-30 VITALS — BP 118/80 | HR 66 | Ht 66.0 in | Wt 143.8 lb

## 2022-08-30 DIAGNOSIS — G20A1 Parkinson's disease without dyskinesia, without mention of fluctuations: Secondary | ICD-10-CM | POA: Diagnosis not present

## 2022-08-30 MED ORDER — PRAMIPEXOLE DIHYDROCHLORIDE 0.125 MG PO TABS: ORAL_TABLET | ORAL | 0 refills | Status: AC

## 2022-08-30 MED ORDER — PRAMIPEXOLE DIHYDROCHLORIDE 0.5 MG PO TABS: 0.5000 mg | ORAL_TABLET | Freq: Three times a day (TID) | ORAL | 1 refills | Status: DC

## 2022-08-30 NOTE — Patient Instructions (Addendum)
Start mirapex (pramipexole) as follows:  0.125 mg - 1 tablet three times per day for a week, then 2 tablets three times per day for a week and then fill the 0.5 mg tablet and take that, 1 pill three times per day   Local and Online Resources for Power over Parkinson's Group  December 2023    LOCAL [place] PARKINSON'S GROUPS   Power over Parkinson's Group:    Power Over Parkinson's Patient Education Group will be Wednesday, December 13th-*Hybrid meting*- in person at [facility] location and via [facility], 2:00-3:00 pm.   Starting in November, Power over [employer] and Care Partner Groups will meet together, with plans for separate break out session for caregivers (*this will be evolving over the next few months) Upcoming Power over Parkinson's Meetings/Care Partner Support:  2nd Wednesdays of the month at 2 pm:   December 13th, January 10th  [place] at [email or web address] if interested in participating in this group    Heath Party!  Wednesday, December 6th, 4:00-5:00 pm.  [facility] and Fitness.  RSVP to Garnetta Buddy at [phone] or [email or web address] New PWR! Moves Dynegy Instructor-Led Classes offering at [employer]!  TUESDAYS and Wednesdays 1-2 pm.   Contact Vonna Kotyk at  [email or web address]  or [phone] (Tuesday classes are modified for chair and standing only) Dance for Parkinson 's classes will be on Tuesdays 9:30am-10:30am starting October 3-December 12 with a break the week of November 21st. Located in the [employer] , in the first floor of the [employer] ([place].) To register:  [email or web address] or [phone]  Drumming for Parkinson's will be held on 2nd and 4th Mondays at 11:00 am.   Located at the [place] ([employer]  [place] at  [email or web address] or [phone]  Through support from the [place] for Parkinson's classes are free for both patients and caregivers.    [facility] [employer] Parkinson's Tai Chi Class, Mondays at 11 am.  Call [phone] for details   [place]:  [email or web address]  PD Health at Home continues:  Mindfulness Mondays, Wellness Wednesdays, Fitness Fridays   Upcoming Education:    Eating and Feeling Well through the [place]. Wednesday, Dec. 6th,  1-2 pm  Hospital Safety.  Wednesday, Dec. 13th, 1-2 pm Register for expert briefings (webinars) at [email or web address]  Please check out their website to sign up for emails and see their full online offerings      [place]:  [email or web address]   Third Thursday Webinars:  On the third Thursday of every month at 12 p.m. ET, join our free live webinars to learn about various aspects of living with Parkinson's disease and our work to speed medical breakthroughs.  Upcoming Webinar:  Tools for Diagnosing and Visualizing Parkinson's Disease.  Thursday, December 21st at 12 noon. Check out additional information on their website to see their full online offerings    [facility]:  [email or web address]  Upcoming Webinar:   Stay tuned  Webinar Series:  Living with Parkinson's Meetup.   Third Thursdays each month, 3 pm  Care Partner Monthly Meetup.  With Robin Searing Phinney.  First Tuesday of each month, 2 pm  Check out additional information to Live Well Today on their website    Parkinson and Movement Disorders (PMD) Alliance:  www.[email or web address]  [employer]:  Online Education Events  Sign  up for emails, which are sent weekly to give you updates on programming and online offerings    Parkinson's Association of the [place]:  www.[email or web address]   Information on online support groups, education events, and online exercises including Yoga, Parkinson's exercises and more-LOTS of information on links to PD resources and online events  Virtual Support Group through Parkinson's Association of the [place]; next one is scheduled for Wednesday, December 6th  at 2 pm.  (These are typically scheduled for the 1st Wednesday of the month at 2 pm).  Visit website for details.   MOVEMENT AND EXERCISE OPPORTUNITIES  PWR! Moves Classes at [place].  Wednesdays 10 and 11 am.   Contact Amy Marriott, PT [email or web address] if interested.  NEW PWR! Moves Class offerings at [employer].  *TUESDAYS* and Wednesdays 1-2 pm.    Contact Vonna Kotyk at  [email or web address]    Parkinson's Wellness Recovery (PWR! Moves)  [email or web address]  Info on the PWR! Virtual Experience:  You will have access to our expertise?through self-assessment, guided plans that start with the PD-specific fundamentals, educational content, tips, Q&A with an expert, and a growing Art therapist of PD-specific pre-recorded and live exercise classes of varying types and intensity - both physical and cognitive! If that is not enough, we offer 1:1 wellness consultations (in-person or virtual) to personalize your PWR! Research scientist (medical).   [place] Fridays:   As part of the PD Health @ Home program, this free video series focuses each week on one aspect of fitness designed to support people living with Parkinson's.? These weekly videos highlight the [place] fitness guidelines for people with Parkinson's disease.  [email or web address]   Dance for PD website is offering free, live-stream classes throughout the week, as well as links to [employer] of classes:  [email or web address]  Virtual dance and Pilates for Parkinson's classes: Click on the Community Tab> Parkinson's  Movement Initiative Tab.  To register for classes and for more information, visit [email or web address] and click the "community" tab.   [employer] Parkinson's Cycling Classes   [facility] [employer]:  Thursdays @ Noon-Live classes at [employer] ([employer] at [email or web address]?or [phone])  [facility]: Virtual Classes Mondays and Thursdays Jeanette Caprice classes Tuesday, Wednesday and Thursday (contact [place] at [email or web address] ?or [phone])  [place]  Varied levels of classes are offered Tuesdays and Thursdays at [employer].   Stretching with Verdis Frederickson weekly class is also offered for people with Parkinson's  To observe a class or for more information, call [phone] or email Hezzie Bump at [email or web address]   ADDITIONAL SUPPORT AND RESOURCES  [employer]:Online Caregiver Education Opportunities:  www.well-[email or web address]/caregiver-education/caregiver-support-group.  You may also contact Vickki Muff at [email or web address] or [phone].     Well-Spring Navigator:  Just1Navigator program, a?free service to help individuals and families through the journey of determining care for older adults.  The "Navigator" is a Education officer, museum, Arnell Asal, who will speak with a prospective client and/or loved ones to provide an assessment of the situation and a set of recommendations for a personalized care plan -- all free of charge, and whether?Well[employer]Spring [employer] offers the needed service or not. If the need is not a service we provide, we are well-connected with reputable programs in town that we can refer you to.  www.well-springsolutions.org or to speak with the Navigator, call [phone].

## 2022-09-05 ENCOUNTER — Ambulatory Visit (HOSPITAL_BASED_OUTPATIENT_CLINIC_OR_DEPARTMENT_OTHER): Payer: Medicare HMO | Attending: Pulmonary Disease | Admitting: Pulmonary Disease

## 2022-09-05 VITALS — Ht 66.0 in | Wt 135.0 lb

## 2022-09-05 DIAGNOSIS — R0683 Snoring: Secondary | ICD-10-CM | POA: Insufficient documentation

## 2022-09-10 ENCOUNTER — Telehealth: Payer: Self-pay | Admitting: Pulmonary Disease

## 2022-09-10 NOTE — Telephone Encounter (Signed)
Call patient  Sleep study result  Date of study: 09/05/2022  Impression:  Snoring  Negative for significant obstructive sleep apnea with only 1.2 events an hour  Recommendation: Optimize sleep hygiene as able, try and get 6 to 8 hours of sleep every night  Clinical follow-up of symptoms

## 2022-09-10 NOTE — Procedures (Signed)
POLYSOMNOGRAPHY  Last, First: Thomas Levy, Thomas Levy MRN: [id number] Gender: Male Age (years): 67 Weight (lbs): 135 DOB: Nov 12, 1955 BMI: 22 Primary Care: No PCP Epworth Score: 10 Referring: Laurin Coder MD Technician: Carolin Coy Interpreting: Laurin Coder MD Study Type: NPSG Ordered Study Type: NPSG Study date: 09/05/2022 Location: [place] CLINICAL INFORMATION Thomas Levy is a 67 year old Male and was referred to the sleep center for evaluation of G47.80 Other Sleep Disorders. Indications include Hypertension, Snoring.  MEDICATIONS Patient self administered medications include: N/A. Medications administered during study include No sleep medicine administered.  SLEEP STUDY TECHNIQUE A multi-channel overnight Polysomnography study was performed. The channels recorded and monitored were central and occipital EEG, electrooculogram (EOG), submentalis EMG (chin), nasal and oral airflow, thoracic and abdominal wall motion, anterior tibialis EMG, snore microphone, electrocardiogram, and a pulse oximetry. TECHNICIAN COMMENTS Comments added by Technician: PATIENT WAS ORDERED AS A NPSG ONLY. Comments added by Scorer: N/A SLEEP ARCHITECTURE The study was initiated at 10:27:19 PM and terminated at 4:49:16 AM. The total recorded time was 382 minutes. EEG confirmed total sleep time was 322 minutes yielding a sleep efficiency of 84.3%. Sleep onset after lights out was 5.2 minutes with a REM latency of 113.0 minutes. The patient spent 17.1% of the night in stage N1 sleep, 66.8% in stage N2 sleep, 0.0% in stage N3 and 16.2% in REM. Wake after sleep onset (WASO) was 54.8 minutes. The Arousal Index was 14.7/hour. RESPIRATORY PARAMETERS There were a total of 1 respiratory disturbances out of which 0 were apneas ( 0 obstructive, 0 mixed, 0 central) and 1 hypopneas. The apnea/hypopnea index (AHI) was 0.2 events/hour. The central sleep apnea index was 0 events/hour. The REM AHI was 1.2 events/hour  and NREM AHI was 0.0 events/hour. The supine AHI was 0.3 events/hour and the non supine AHI was 0 supine during 73.91% of sleep. Respiratory disturbances were associated with oxygen desaturation down to a nadir of 90.0% during sleep. The mean oxygen saturation during the study was 93.5%. The cumulative time under 88% oxygen saturation was 5.5 minutes.  LEG MOVEMENT DATA The total leg movements were 0 with a resulting leg movement index of 0.0/hr .Associated arousal with leg movement index was 0.0/hr.  CARDIAC DATA The underlying cardiac rhythm was most consistent with sinus rhythm. Mean heart rate during sleep was 60.5 bpm. Additional rhythm abnormalities include None.  IMPRESSIONS - No Significant Obstructive Sleep apnea(OSA) - EKG showed no cardiac abnormalities. - No significant Oxygen Desaturation - The patient snored with soft snoring volume. - No significant periodic leg movements(PLMs) during sleep. However, no significant associated arousals.  DIAGNOSIS - Snoring - Negative for significant sleep apnea  RECOMMENDATIONS - Avoid alcohol, sedatives and other CNS depressants that may worsen sleep apnea and disrupt normal sleep architecture. - Sleep hygiene should be reviewed to assess factors that may improve sleep quality. - Weight management and regular exercise should be initiated or continued.  [Electronically signed] 09/10/2022 08:16 AM  Sherrilyn Rist MD NPI: [id number]

## 2022-09-11 NOTE — Telephone Encounter (Signed)
Spoke with pt and notified of results per Dr. Ander Slade. Pt verbalized understanding and denied any questions.

## 2022-09-28 ENCOUNTER — Encounter: Payer: Medicare HMO | Admitting: Family Medicine

## 2022-10-02 ENCOUNTER — Ambulatory Visit (HOSPITAL_BASED_OUTPATIENT_CLINIC_OR_DEPARTMENT_OTHER)
Admission: RE | Admit: 2022-10-02 | Discharge: 2022-10-02 | Disposition: A | Discharge status: Home / Self Care | Payer: Medicare HMO | Source: Ambulatory Visit | Attending: Family Medicine | Admitting: Family Medicine

## 2022-10-02 DIAGNOSIS — Z87891 Personal history of nicotine dependence: Secondary | ICD-10-CM | POA: Diagnosis not present

## 2022-10-02 DIAGNOSIS — R911 Solitary pulmonary nodule: Secondary | ICD-10-CM | POA: Diagnosis not present

## 2022-11-09 ENCOUNTER — Encounter: Payer: Self-pay | Admitting: Neurology

## 2022-11-10 ENCOUNTER — Ambulatory Visit (INDEPENDENT_AMBULATORY_CARE_PROVIDER_SITE_OTHER): Payer: Medicare HMO | Admitting: Family Medicine

## 2022-11-10 ENCOUNTER — Encounter: Payer: Self-pay | Admitting: Family Medicine

## 2022-11-10 ENCOUNTER — Encounter: Payer: Self-pay | Admitting: Gastroenterology

## 2022-11-10 VITALS — BP 118/78 | HR 67 | Temp 98.1°F | Resp 18 | Ht 66.0 in | Wt 137.0 lb

## 2022-11-10 DIAGNOSIS — I10 Essential (primary) hypertension: Secondary | ICD-10-CM | POA: Diagnosis not present

## 2022-11-10 DIAGNOSIS — Z1211 Encounter for screening for malignant neoplasm of colon: Secondary | ICD-10-CM

## 2022-11-10 DIAGNOSIS — M109 Gout, unspecified: Secondary | ICD-10-CM | POA: Diagnosis not present

## 2022-11-10 DIAGNOSIS — R251 Tremor, unspecified: Secondary | ICD-10-CM | POA: Diagnosis not present

## 2022-11-10 DIAGNOSIS — E785 Hyperlipidemia, unspecified: Secondary | ICD-10-CM | POA: Diagnosis not present

## 2022-11-10 DIAGNOSIS — Z0000 Encounter for general adult medical examination without abnormal findings: Secondary | ICD-10-CM | POA: Diagnosis not present

## 2022-11-10 DIAGNOSIS — N529 Male erectile dysfunction, unspecified: Secondary | ICD-10-CM

## 2022-11-10 LAB — COMPREHENSIVE METABOLIC PANEL
ALT: 20 U/L (ref 0–53)
AST: 30 U/L (ref 0–37)
Albumin: 4.3 g/dL (ref 3.5–5.2)
Alkaline Phosphatase: 61 U/L (ref 39–117)
BUN: 14 mg/dL (ref 6–23)
CO2: 31 mEq/L (ref 19–32)
Calcium: 9.8 mg/dL (ref 8.4–10.5)
Chloride: 98 mEq/L (ref 96–112)
Creatinine, Ser: 0.97 mg/dL (ref 0.40–1.50)
GFR: 80.69 mL/min (ref >60.00)
Glucose, Bld: 90 mg/dL (ref 70–99)
Potassium: 3.7 mEq/L (ref 3.5–5.1)
Sodium: 138 mEq/L (ref 135–145)
Total Bilirubin: 0.7 mg/dL (ref 0.2–1.2)
Total Protein: 6.9 g/dL (ref 6.0–8.3)

## 2022-11-10 LAB — LIPID PANEL
Cholesterol: 167 mg/dL (ref 0–200)
HDL: 83.4 mg/dL (ref >39.00)
LDL Cholesterol: 72 mg/dL (ref 0–99)
NonHDL: 83.87
Total CHOL/HDL Ratio: 2
Triglycerides: 60 mg/dL (ref 0.0–149.0)
VLDL: 12 mg/dL (ref 0.0–40.0)

## 2022-11-10 LAB — CBC WITH DIFFERENTIAL/PLATELET
Basophils Absolute: 0 10*3/uL (ref 0.0–0.1)
Basophils Relative: 0.9 % (ref 0.0–3.0)
Eosinophils Absolute: 0.1 10*3/uL (ref 0.0–0.7)
Eosinophils Relative: 2.3 % (ref 0.0–5.0)
HCT: 43.9 % (ref 39.0–52.0)
Hemoglobin: 14.9 g/dL (ref 13.0–17.0)
Lymphocytes Relative: 30.5 % (ref 12.0–46.0)
Lymphs Abs: 1.5 10*3/uL (ref 0.7–4.0)
MCHC: 34 g/dL (ref 30.0–36.0)
MCV: 95.9 fl (ref 78.0–100.0)
Monocytes Absolute: 0.3 10*3/uL (ref 0.1–1.0)
Monocytes Relative: 6.1 % (ref 3.0–12.0)
Neutro Abs: 3 10*3/uL (ref 1.4–7.7)
Neutrophils Relative %: 60.2 % (ref 43.0–77.0)
Platelets: 184 10*3/uL (ref 150.0–400.0)
RBC: 4.58 Mil/uL (ref 4.22–5.81)
RDW: 13.1 % (ref 11.5–15.5)
WBC: 5 10*3/uL (ref 4.0–10.5)

## 2022-11-10 LAB — URIC ACID: Uric Acid, Serum: 4.7 mg/dL (ref 4.0–7.8)

## 2022-11-10 LAB — PSA: PSA: 1.09 ng/mL (ref 0.10–4.00)

## 2022-11-10 NOTE — Patient Instructions (Signed)
nPreventive Care 65 Years and Older, Male Preventive care refers to lifestyle choices and visits with your health care provider that can promote health and wellness. Preventive care visits are also called wellness exams. What can I expect for my preventive care visit? Counseling During your preventive care visit, your health care provider may ask about your: Medical history, including: Past medical problems. Family medical history. History of falls. Current health, including: Emotional well-being. Home life and relationship well-being. Sexual activity. Memory and ability to understand (cognition). Lifestyle, including: Alcohol, nicotine or tobacco, and drug use. Access to firearms. Diet, exercise, and sleep habits. Work and work Statistician. Sunscreen use. Safety issues such as seatbelt and bike helmet use. Physical exam Your health care provider will check your: Height and weight. These may be used to calculate your BMI (body mass index). BMI is a measurement that tells if you are at a healthy weight. Waist circumference. This measures the distance around your waistline. This measurement also tells if you are at a healthy weight and may help predict your risk of certain diseases, such as type 2 diabetes and high blood pressure. Heart rate and blood pressure. Body temperature. Skin for abnormal spots. What immunizations do I need?  Vaccines are usually given at various ages, according to a schedule. Your health care provider will recommend vaccines for you based on your age, medical history, and lifestyle or other factors, such as travel or where you work. What tests do I need? Screening Your health care provider may recommend screening tests for certain conditions. This may include: Lipid and cholesterol levels. Diabetes screening. This is done by checking your blood sugar (glucose) after you have not eaten for a while (fasting). Hepatitis C test. Hepatitis B test. HIV (human  immunodeficiency virus) test. STI (sexually transmitted infection) testing, if you are at risk. Lung cancer screening. Colorectal cancer screening. Prostate cancer screening. Abdominal aortic aneurysm (AAA) screening. You may need this if you are a current or former smoker. Talk with your health care provider about your test results, treatment options, and if necessary, the need for more tests. Follow these instructions at home: Eating and drinking  Eat a diet that includes fresh fruits and vegetables, whole grains, lean protein, and low-fat dairy products. Limit your intake of foods with high amounts of sugar, saturated fats, and salt. Take vitamin and mineral supplements as recommended by your health care provider. Do not drink alcohol if your health care provider tells you not to drink. If you drink alcohol: Limit how much you have to 0-2 drinks a day. Know how much alcohol is in your drink. In the [place] one drink equals one 12 oz bottle of beer (355 mL), one 5 oz glass of wine (148 mL), or one 1 oz glass of hard liquor (44 mL). Lifestyle Brush your teeth every morning and night with fluoride toothpaste. Floss one time each day. Exercise for at least 30 minutes 5 or more days each week. Do not use any products that contain nicotine or tobacco. These products include cigarettes, chewing tobacco, and vaping devices, such as e-cigarettes. If you need help quitting, ask your health care provider. Do not use drugs. If you are sexually active, practice safe sex. Use a condom or other form of protection to prevent STIs. Take aspirin only as told by your health care provider. Make sure that you understand how much to take and what form to take. Work with your health care provider to find out whether it is safe  and beneficial for you to take aspirin daily. Ask your health care provider if you need to take a cholesterol-lowering medicine (statin). Find healthy ways to manage stress, such  as: Meditation, yoga, or listening to music. Journaling. Talking to a trusted person. Spending time with friends and family. Safety Always wear your seat belt while driving or riding in a vehicle. Do not drive: If you have been drinking alcohol. Do not ride with someone who has been drinking. When you are tired or distracted. While texting. If you have been using any mind-altering substances or drugs. Wear a helmet and other protective equipment during sports activities. If you have firearms in your house, make sure you follow all gun safety procedures. Minimize exposure to UV radiation to reduce your risk of skin cancer. What's next? Visit your health care provider once a year for an annual wellness visit. Ask your health care provider how often you should have your eyes and teeth checked. Stay up to date on all vaccines. This information is not intended to replace advice given to you by your health care provider. Make sure you discuss any questions you have with your health care provider. Document Revised: 02/09/2021 Document Reviewed: 02/09/2021 Elsevier Patient Education  2023 Elsevier Inc.  

## 2022-11-10 NOTE — Progress Notes (Signed)
Subjective:   By signing my name below, I, Thomas Levy, attest that this documentation has been prepared under the direction and in the presence of Ann Held, DO 11/10/22   Patient ID: Thomas Levy, male    DOB: 07-16-1956, 67 y.o.   MRN: [id number]  Chief Complaint  Patient presents with   Annual Exam    Pt states fasting     HPI Patient is in today for a comprehensive physical exam. He is overall well.  He continues to follow up with Dr. Carles Collet for his tremor. He reports no new changes to his tremor.   Last colonoscopy: results were normal. Next due in 11/2022. He plans to schedule an appointment for a colonoscopy soon.   Last 3 PSA: Lab Results  Component Value Date   PSA 1.09 11/10/2022   PSA 1.00 09/27/2021   PSA 0.79 09/10/2020   He is UTD on his Covid vaccine, last received Pfizer in 04/2023.   Past Medical History:  Diagnosis Date   Hyperlipidemia    Hypertension     Past Surgical History:  Procedure Laterality Date   HERNIA REPAIR     VASECTOMY      Family History  Problem Relation Age of Onset   Hypertension Mother    Cancer Mother        breast   Kidney disease Father        kidney disease-- on dialysis   Hypertension Brother    Hyperlipidemia Brother    Thyroid cancer Child     Social History   Socioeconomic History   Marital status: Married    Spouse name: Not on file   Number of children: Not on file   Years of education: Not on file   Highest education level: Not on file  Occupational History   Not on file  Tobacco Use   Smoking status: Former    Types: Cigars    Start date: 08/29/2008   Smokeless tobacco: Never   Tobacco comments:    Quit smoking cigaretts 5 years ago    Smoked 1 cig a day x 30 years   Substance and Sexual Activity   Alcohol use: Yes    Alcohol/week: 7.0 standard drinks of alcohol    Types: 7 Glasses of wine per week   Drug use: No   Sexual activity: Yes    Partners: Female  Other Topics Concern    Not on file  Social History Narrative   Gym-- 4 days a week for 1 hour      Right Handed   Lives in a one story home    Drinks no Caffeine    Social Determinants of Health   Financial Resource Strain: Low Risk  (12/23/2021)   Overall Financial Resource Strain (CARDIA)    Difficulty of Paying Living Expenses: Not hard at all  Food Insecurity: No Food Insecurity (12/23/2021)   Hunger Vital Sign    Worried About Running Out of Food in the Last Year: Never true    Ran Out of Food in the Last Year: Never true  Transportation Needs: No Transportation Needs (12/23/2021)   PRAPARE - Hydrologist (Medical): No    Lack of Transportation (Non-Medical): No  Physical Activity: Sufficiently Active (12/23/2021)   Exercise Vital Sign    Days of Exercise per Week: 7 days    Minutes of Exercise per Session: 60 min  Stress: No Stress Concern Present (12/23/2021)  [employer] of Occupational [employer] - Occupational Stress Questionnaire    Feeling of Stress : Not at all  Social Connections: Moderately Isolated (12/23/2021)   Social Connection and Isolation Panel [NHANES]    Frequency of Communication with Friends and Family: More than three times a week    Frequency of Social Gatherings with Friends and Family: Once a week    Attends Religious Services: Never    Marine scientist or Organizations: No    Attends Archivist Meetings: Never    Marital Status: Married  Human resources officer Violence: Not At Risk (12/23/2021)   Humiliation, Afraid, Rape, and Kick questionnaire    Fear of Current or Ex-Partner: No    Emotionally Abused: No    Physically Abused: No    Sexually Abused: No    Outpatient Medications Prior to Visit  Medication Sig Dispense Refill   albuterol (VENTOLIN HFA) 108 (90 Base) MCG/ACT inhaler Inhale 2 puffs into the lungs every 6 (six) hours as needed for wheezing or shortness of breath. 18 g 5   allopurinol (ZYLOPRIM) 300 MG tablet TAKE 1  TABLET BY MOUTH EVERY DAY 90 tablet 1   ALPRAZolam (XANAX) 0.5 MG tablet Take 1 tablet (0.5 mg total) by mouth at bedtime as needed for anxiety. 30 tablet 0   atorvastatin (LIPITOR) 20 MG tablet Take 1 tablet (20 mg total) by mouth at bedtime. 90 tablet 1   lisinopril-hydrochlorothiazide (ZESTORETIC) 20-12.5 MG tablet Take 2 tablets by mouth daily. 180 tablet 1   pramipexole (MIRAPEX) 0.125 MG tablet 1 tablet three times per day for a week, then 2 po three times per day for a week 73 tablet 0   pramipexole (MIRAPEX) 0.5 MG tablet Take 1 tablet (0.5 mg total) by mouth 3 (three) times daily. 270 tablet 1   sildenafil (REVATIO) 20 MG tablet Take 1-5 tablets by mouth one hour prior to intercourse as needed 100 tablet 2   zolpidem (AMBIEN) 10 MG tablet Take 1 tablet (10 mg total) by mouth at bedtime as needed for sleep. 30 tablet 3   nystatin (MYCOSTATIN/NYSTOP) powder Apply topically 4 (four) times daily. (Patient not taking: Reported on 08/30/2022) 15 g 0   primidone (MYSOLINE) 50 MG tablet Take 1 tablet (50 mg total) by mouth 3 (three) times daily. (Patient not taking: Reported on 08/30/2022) 90 tablet 1   No facility-administered medications prior to visit.    No Known Allergies  Review of Systems  Constitutional:  Negative for fever and malaise/fatigue.  HENT:  Negative for congestion.   Eyes:  Negative for blurred vision.  Respiratory:  Negative for shortness of breath.   Cardiovascular:  Negative for chest pain, palpitations and leg swelling.  Gastrointestinal:  Negative for abdominal pain, blood in stool and nausea.  Genitourinary:  Negative for dysuria and frequency.  Musculoskeletal:  Negative for falls.  Skin:  Negative for rash.  Neurological:  Positive for tremors (preexisting condition). Negative for dizziness, loss of consciousness and headaches.  Endo/Heme/Allergies:  Negative for environmental allergies.  Psychiatric/Behavioral:  Negative for depression. The patient is not  nervous/anxious.        Objective:    Physical Exam Vitals and nursing note reviewed.  Constitutional:      General: He is not in acute distress.    Appearance: Normal appearance. He is well-developed.  HENT:     Head: Normocephalic and atraumatic.     Right Ear: Tympanic membrane, ear canal and external ear normal.  Left Ear: Tympanic membrane, ear canal and external ear normal.  Eyes:     Extraocular Movements: Extraocular movements intact.     Pupils: Pupils are equal, round, and reactive to light.  Neck:     Thyroid: No thyromegaly.  Cardiovascular:     Rate and Rhythm: Normal rate and regular rhythm.     Heart sounds: Normal heart sounds. No murmur heard.    No gallop.  Pulmonary:     Effort: Pulmonary effort is normal. No respiratory distress.     Breath sounds: Normal breath sounds. No wheezing or rales.  Chest:     Chest wall: No tenderness.  Abdominal:     Palpations: Abdomen is soft.     Tenderness: There is no abdominal tenderness.  Musculoskeletal:     Cervical back: Normal range of motion and neck supple.     Right hip: Tenderness present. Normal range of motion. Normal strength.     Left hip: Tenderness present. Normal range of motion. Normal strength.     Right foot: Bony tenderness present. No swelling.     Left foot: Bony tenderness present. No swelling.  Skin:    General: Skin is warm and dry.  Neurological:     Mental Status: He is alert and oriented to person, place, and time.  Psychiatric:        Behavior: Behavior normal.        Thought Content: Thought content normal.        Judgment: Judgment normal.     BP 118/78 (BP Location: Left Arm, Patient Position: Sitting, Cuff Size: Normal)   Pulse 67   Temp 98.1 F (36.7 C) (Oral)   Resp 18   Ht 5\' 6"  (1.676 m)   Wt 137 lb (62.1 kg)   SpO2 94%   BMI 22.11 kg/m  Wt Readings from Last 3 Encounters:  11/10/22 137 lb (62.1 kg)  09/05/22 135 lb (61.2 kg)  08/30/22 143 lb 12.8 oz (65.2 kg)        Assessment & Plan:  Preventative health care  Primary hypertension -     Lipid panel -     PSA -     Comprehensive metabolic panel -     CBC with Differential/Platelet  Tremor of left hand  Vasculogenic erectile dysfunction, unspecified vasculogenic erectile dysfunction type -     PSA  Gout of foot, unspecified cause, unspecified chronicity, unspecified laterality -     Uric acid  Hyperlipidemia, unspecified hyperlipidemia type -     Lipid panel -     Comprehensive metabolic panel  Colon cancer screening -     Ambulatory referral to Gastroenterology     I,Rachel Rivera,acting as a scribe for Ann Held, DO.,have documented all relevant documentation on the behalf of Ann Held, DO,as directed by  Ann Held, DO while in the presence of Ann Held, DO.   I, Ann Held, DO, personally preformed the services described in this documentation.  All medical record entries made by the scribe were at my direction and in my presence.  I have reviewed the chart and discharge instructions (if applicable) and agree that the record reflects my personal performance and is accurate and complete. 11/10/22   Ann Held, DO

## 2022-11-13 ENCOUNTER — Encounter: Payer: Self-pay | Admitting: Family Medicine

## 2022-11-17 ENCOUNTER — Encounter: Payer: Self-pay | Admitting: Family Medicine

## 2022-11-30 NOTE — Progress Notes (Signed)
Assessment/Plan:   1.  Parkinsons Disease, mild  -Patient has now met formal criteria.  -We have trialed levodopa, but that did not help the tremor.  It doesn't in 30-50% of Parkinsons Disease paitents.   -Have discussed DaT scan's and skin biopsies.  -He is currently on pramipexole 0.5 mg 3 times per day.  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  We decided to continue him on that.  -I talked to the patient about DBS and focused ultrasound, which may be options in the future for levodopa resistant tremor.  I really do not like to do this early on, however.  We discussed early STIM results out of [place] several years ago.  -Discussed importance of safe, cardiovascular exercise.    2.  Lightheadedness/dizziness  -Discussed that Parkinsons disease and other medicines to treat it do often lower blood pressure to the point that patient's often get off of the blood pressure medications.  He is currently on lisinopril/hydrochlorothiazide.  Looking through his review flowsheets, his blood pressure has actually been low for quite some time.  He was not orthostatic today.  Nonetheless, I am going to contact his primary care and see if we can either get off of his blood pressure medication or lower the dosage.  His primary care physician was out of the office when I tried to reach her today, so we will see if we can reach her next week.  Discussed concept of permissive hypertension as it relates to Parkinsons disease management.   Subjective:   Thomas Levy was seen today in follow up for mild Parkinsons disease.  Last visit, we started him on pramipexole.  He emailed me about 8 weeks later and stated that he felt lightheaded, so much so that he was near syncopal.  He did not think that his blood pressure was particularly low and was drinking plenty of water.  He is on Zestoretic.  He states that when he is leaning over in the garden and stands back up, he will need to steady himself b/c of dizziness.      Current prescribed movement disorder medications: Propranolol, 10 mg 3 times per day (now off)   PREVIOUS MEDICATIONS: Carbidopa/levodopa 25/100, 1 tablet 3 times per day (tolerated it, but is stopped because it did not help tremor); primidone; propranolol 10 mg tid (? Affect breathing)  ALLERGIES:  No Known Allergies  CURRENT MEDICATIONS:  Current Meds  Medication Sig   albuterol (VENTOLIN HFA) 108 (90 Base) MCG/ACT inhaler Inhale 2 puffs into the lungs every 6 (six) hours as needed for wheezing or shortness of breath.   allopurinol (ZYLOPRIM) 300 MG tablet TAKE 1 TABLET BY MOUTH EVERY DAY   ALPRAZolam (XANAX) 0.5 MG tablet Take 1 tablet (0.5 mg total) by mouth at bedtime as needed for anxiety.   atorvastatin (LIPITOR) 20 MG tablet Take 1 tablet (20 mg total) by mouth at bedtime.   lisinopril-hydrochlorothiazide (ZESTORETIC) 20-12.5 MG tablet Take 2 tablets by mouth daily.   pramipexole (MIRAPEX) 0.125 MG tablet 1 tablet three times per day for a week, then 2 po three times per day for a week   pramipexole (MIRAPEX) 0.5 MG tablet Take 1 tablet (0.5 mg total) by mouth 3 (three) times daily.   sildenafil (REVATIO) 20 MG tablet Take 1-5 tablets by mouth one hour prior to intercourse as needed     Objective:   PHYSICAL EXAMINATION:    VITALS:   Vitals:   12/01/22 1414 12/01/22 1415 12/01/22  1416  SpO2: 97% 98% 97%  Weight: 140 lb 3.2 oz (63.6 kg) 140 lb 3.2 oz (63.6 kg) 140 lb 3.2 oz (63.6 kg)  Height: 5\' 6"  (1.676 m) 5\' 6"  (1.676 m) 5\' 6"  (1.676 m)     GEN:  The patient appears stated age and is in NAD. HEENT:  Normocephalic, atraumatic.  The mucous membranes are moist. The superficial temporal arteries are without ropiness or tenderness.   Neurological examination:  Orientation: The patient is alert and oriented x3. Cranial nerves: There is good facial symmetry without facial hypomimia. The speech is fluent and clear. Soft palate rises symmetrically and there is no  tongue deviation. Hearing is intact to conversational tone. Sensation: Sensation is intact to light touch throughout Motor: Strength is at least antigravity x4.  Movement examination: Tone: There is normal tone in the upper and lower extremities. Abnormal movements:there is LUE rest tremor that increases with distraction Coordination:  There is no decremation today. Gait and Station: The patient has no difficulty arising out of a deep-seated chair without the use of the hands. The patient's stride length is good and good armswing today.  I have reviewed and interpreted the following labs independently    Chemistry      Component Value Date/Time   NA 138 11/10/2022 0856   K 3.7 11/10/2022 0856   CL 98 11/10/2022 0856   CO2 31 11/10/2022 0856   BUN 14 11/10/2022 0856   CREATININE 0.97 11/10/2022 0856   CREATININE 1.03 09/10/2020 1400      Component Value Date/Time   CALCIUM 9.8 11/10/2022 0856   ALKPHOS 61 11/10/2022 0856   AST 30 11/10/2022 0856   ALT 20 11/10/2022 0856   BILITOT 0.7 11/10/2022 0856       Lab Results  Component Value Date   WBC 5.0 11/10/2022   HGB 14.9 11/10/2022   HCT 43.9 11/10/2022   MCV 95.9 11/10/2022   PLT 184.0 11/10/2022    Lab Results  Component Value Date   TSH 1.41 02/16/2021      Cc:  Donato Schultz, DO

## 2022-12-01 ENCOUNTER — Encounter: Payer: Self-pay | Admitting: Neurology

## 2022-12-01 ENCOUNTER — Ambulatory Visit: Payer: Medicare HMO | Admitting: Neurology

## 2022-12-01 VITALS — Ht 66.0 in | Wt 140.2 lb

## 2022-12-01 DIAGNOSIS — G20A1 Parkinson's disease without dyskinesia, without mention of fluctuations: Secondary | ICD-10-CM

## 2022-12-01 DIAGNOSIS — R42 Dizziness and giddiness: Secondary | ICD-10-CM

## 2022-12-05 ENCOUNTER — Encounter: Payer: Self-pay | Admitting: Neurology

## 2022-12-06 ENCOUNTER — Telehealth: Payer: Self-pay

## 2022-12-06 MED ORDER — LISINOPRIL 10 MG PO TABS: 10.0000 mg | ORAL_TABLET | Freq: Every day | ORAL | 2 refills | Status: DC

## 2022-12-06 NOTE — Telephone Encounter (Signed)
Received from Dr. Laury Axon   Dr Tat reached out to me---- his bp has been running low  We need to change his lisinopril hct --- to lisinopril 10 mg 1 po qd #30  2 refills    Pt made aware. New rx will be sent

## 2022-12-06 NOTE — Telephone Encounter (Signed)
-----   Message from Donato Schultz, DO sent at 12/05/2022  7:41 AM EDT ----- Dr Tat reached out to me---- his bp has been running low  We need to change his lisinopril hct --- to lisinopril 10 mg 1 po qd #30  2 refills

## 2022-12-08 ENCOUNTER — Other Ambulatory Visit: Payer: Self-pay | Admitting: Family Medicine

## 2022-12-08 DIAGNOSIS — E785 Hyperlipidemia, unspecified: Secondary | ICD-10-CM

## 2022-12-10 ENCOUNTER — Other Ambulatory Visit: Payer: Self-pay | Admitting: Neurology

## 2022-12-12 ENCOUNTER — Telehealth: Payer: Self-pay | Admitting: Family Medicine

## 2022-12-12 NOTE — Telephone Encounter (Signed)
Copied from CRM [id number]. Topic: Medicare AWV >> Dec 12, 2022  1:36 PM Payton Doughty wrote: Reason for CRM: Called patient to schedule Medicare Annual Wellness Visit (AWV). Left message for patient to call back and schedule Medicare Annual Wellness Visit (AWV).  Last date of AWV: 12/23/21  Please schedule an appointment at any time with Donne Anon, CMA  .  If any questions, please contact me.  Thank you ,  Verlee Rossetti; Care Guide Ambulatory Clinical Support [facility] Direct Dial: [phone]

## 2022-12-25 ENCOUNTER — Other Ambulatory Visit (HOSPITAL_COMMUNITY): Payer: Self-pay

## 2022-12-25 ENCOUNTER — Encounter: Payer: Self-pay | Admitting: Gastroenterology

## 2022-12-25 ENCOUNTER — Ambulatory Visit (AMBULATORY_SURGERY_CENTER): Payer: Medicare HMO | Admitting: *Deleted

## 2022-12-25 VITALS — Ht 66.0 in | Wt 137.0 lb

## 2022-12-25 DIAGNOSIS — Z1211 Encounter for screening for malignant neoplasm of colon: Secondary | ICD-10-CM

## 2022-12-25 MED ORDER — NA SULFATE-K SULFATE-MG SULF 17.5-3.13-1.6 GM/177ML PO SOLN: 1.0000 | Freq: Once | ORAL | 0 refills | Status: AC

## 2022-12-25 NOTE — Progress Notes (Signed)

## 2023-01-23 ENCOUNTER — Encounter: Payer: Self-pay | Admitting: Gastroenterology

## 2023-01-23 ENCOUNTER — Ambulatory Visit (AMBULATORY_SURGERY_CENTER): Payer: Medicare HMO | Admitting: Gastroenterology

## 2023-01-23 VITALS — BP 118/80 | HR 54 | Temp 97.8°F | Resp 14 | Ht 66.0 in | Wt 137.0 lb

## 2023-01-23 DIAGNOSIS — Z1211 Encounter for screening for malignant neoplasm of colon: Secondary | ICD-10-CM | POA: Diagnosis not present

## 2023-01-23 DIAGNOSIS — J45909 Unspecified asthma, uncomplicated: Secondary | ICD-10-CM | POA: Diagnosis not present

## 2023-01-23 DIAGNOSIS — I10 Essential (primary) hypertension: Secondary | ICD-10-CM | POA: Diagnosis not present

## 2023-01-23 DIAGNOSIS — G20A1 Parkinson's disease without dyskinesia, without mention of fluctuations: Secondary | ICD-10-CM | POA: Diagnosis not present

## 2023-01-23 MED ORDER — SODIUM CHLORIDE 0.9 % IV SOLN: 500.0000 mL | Freq: Once | INTRAVENOUS | Status: DC

## 2023-01-23 NOTE — Progress Notes (Signed)
[facility] History and Physical   Primary Care Physician:  Zola Button, Grayling Congress, DO   Reason for Procedure:   CRC screening  Plan:    colon     HPI: Thomas Levy is a 67 y.o. male    Past Medical History:  Diagnosis Date   Allergy    SEASONAL   Asthma    Hyperlipidemia    Hypertension    Neuromuscular disorder (HCC)    Parkinson's disease     Past Surgical History:  Procedure Laterality Date   HERNIA REPAIR     VASECTOMY      Prior to Admission medications   Medication Sig Start Date End Date Taking? Authorizing Provider  allopurinol (ZYLOPRIM) 300 MG tablet TAKE 1 TABLET BY MOUTH EVERY DAY 07/22/22  Yes Zola Button, Grayling Congress, DO  ALPRAZolam Prudy Feeler) 0.5 MG tablet Take 1 tablet (0.5 mg total) by mouth at bedtime as needed for anxiety. 11/07/21  Yes Seabron Spates R, DO  atorvastatin (LIPITOR) 20 MG tablet TAKE 1 TABLET BY MOUTH EVERYDAY AT BEDTIME 12/08/22  Yes Seabron Spates R, DO  lisinopril (ZESTRIL) 10 MG tablet Take 1 tablet (10 mg total) by mouth daily. 12/06/22  Yes Donato Schultz, DO  pramipexole (MIRAPEX) 0.125 MG tablet 1 tablet three times per day for a week, then 2 po three times per day for a week 08/30/22  Yes Tat, Octaviano Batty, DO  pramipexole (MIRAPEX) 0.5 MG tablet TAKE 1 TABLET BY MOUTH 3 TIMES DAILY. 12/11/22  Yes Tat, Octaviano Batty, DO  sildenafil (REVATIO) 20 MG tablet Take 1-5 tablets by mouth one hour prior to intercourse as needed 05/23/22  Yes Lowne Chase, Yvonne R, DO  albuterol (VENTOLIN HFA) 108 (90 Base) MCG/ACT inhaler Inhale 2 puffs into the lungs every 6 (six) hours as needed for wheezing or shortness of breath. 01/31/22   Donato Schultz, DO  zolpidem (AMBIEN) 10 MG tablet Take 1 tablet (10 mg total) by mouth at bedtime as needed for sleep. 06/08/22 10/06/22  Tomma Lightning, MD    Current Outpatient Medications  Medication Sig Dispense Refill   allopurinol (ZYLOPRIM) 300 MG tablet TAKE 1 TABLET BY MOUTH EVERY  DAY 90 tablet 1   ALPRAZolam (XANAX) 0.5 MG tablet Take 1 tablet (0.5 mg total) by mouth at bedtime as needed for anxiety. 30 tablet 0   atorvastatin (LIPITOR) 20 MG tablet TAKE 1 TABLET BY MOUTH EVERYDAY AT BEDTIME 90 tablet 1   lisinopril (ZESTRIL) 10 MG tablet Take 1 tablet (10 mg total) by mouth daily. 30 tablet 2   pramipexole (MIRAPEX) 0.125 MG tablet 1 tablet three times per day for a week, then 2 po three times per day for a week 73 tablet 0   pramipexole (MIRAPEX) 0.5 MG tablet TAKE 1 TABLET BY MOUTH 3 TIMES DAILY. 270 tablet 0   sildenafil (REVATIO) 20 MG tablet Take 1-5 tablets by mouth one hour prior to intercourse as needed 100 tablet 2   albuterol (VENTOLIN HFA) 108 (90 Base) MCG/ACT inhaler Inhale 2 puffs into the lungs every 6 (six) hours as needed for wheezing or shortness of breath. 18 g 5   zolpidem (AMBIEN) 10 MG tablet Take 1 tablet (10 mg total) by mouth at bedtime as needed for sleep. 30 tablet 3   Current Facility-Administered Medications  Medication Dose Route Frequency Provider Last Rate Last Admin   0.9 %  sodium chloride infusion  500 mL Intravenous Once Chales Abrahams,  Filbert Berthold, MD        Allergies as of 01/23/2023   (No Known Allergies)    Family History  Problem Relation Age of Onset   Hypertension Mother    Cancer Mother        breast   Kidney disease Father        kidney disease-- on dialysis   Hypertension Brother    Hyperlipidemia Brother    Lung cancer Brother    Colon polyps Brother    Thyroid cancer Child    Colon cancer Neg Hx    Crohn's disease Neg Hx    Esophageal cancer Neg Hx    Rectal cancer Neg Hx    Stomach cancer Neg Hx    Ulcerative colitis Neg Hx     Social History   Socioeconomic History   Marital status: Married    Spouse name: Not on file   Number of children: Not on file   Years of education: Not on file   Highest education level: Not on file  Occupational History   Not on file  Tobacco Use   Smoking status: Former    Types:  Cigars    Start date: 08/29/2008   Smokeless tobacco: Never   Tobacco comments:    Quit smoking cigaretts 5 years ago    Smoked 1 cig a day x 30 years   Vaping Use   Vaping Use: Never used  Substance and Sexual Activity   Alcohol use: Yes    Alcohol/week: 7.0 standard drinks of alcohol    Types: 7 Glasses of wine per week    Comment: WEEKLY   Drug use: No   Sexual activity: Yes    Partners: Female  Other Topics Concern   Not on file  Social History Narrative   Gym-- 4 days a week for 1 hour      Right Handed   Lives in a one story home    Drinks no Caffeine    Social Determinants of Health   Financial Resource Strain: Low Risk  (12/23/2021)   Overall Financial Resource Strain (CARDIA)    Difficulty of Paying Living Expenses: Not hard at all  Food Insecurity: No Food Insecurity (12/23/2021)   Hunger Vital Sign    Worried About Running Out of Food in the Last Year: Never true    Ran Out of Food in the Last Year: Never true  Transportation Needs: No Transportation Needs (12/23/2021)   PRAPARE - Administrator, Civil Service (Medical): No    Lack of Transportation (Non-Medical): No  Physical Activity: Sufficiently Active (12/23/2021)   Exercise Vital Sign    Days of Exercise per Week: 7 days    Minutes of Exercise per Session: 60 min  Stress: No Stress Concern Present (12/23/2021)   [employer] of Occupational Health - Occupational Stress Questionnaire    Feeling of Stress : Not at all  Social Connections: Moderately Isolated (12/23/2021)   Social Connection and Isolation Panel [NHANES]    Frequency of Communication with Friends and Family: More than three times a week    Frequency of Social Gatherings with Friends and Family: Once a week    Attends Religious Services: Never    Database administrator or Organizations: No    Attends Banker Meetings: Never    Marital Status: Married  Catering manager Violence: Not At Risk (12/23/2021)    Humiliation, Afraid, Rape, and Kick questionnaire    Fear of Current or  Ex-Partner: No    Emotionally Abused: No    Physically Abused: No    Sexually Abused: No    Review of Systems: Positive for none All other review of systems negative except as mentioned in the HPI.  Physical Exam: Vital signs in last 24 hours: @VSRANGES @   General:   Alert,  Well-developed, well-nourished, pleasant and cooperative in NAD Lungs:  Clear throughout to auscultation.   Heart:  Regular rate and rhythm; no murmurs, clicks, rubs,  or gallops. Abdomen:  Soft, nontender and nondistended. Normal bowel sounds.   Neuro/Psych:  Alert and cooperative. Normal mood and affect. A and O x 3    No significant changes were identified.  The patient continues to be an appropriate candidate for the planned procedure and anesthesia.   Edman Circle, MD. [facility] 01/23/2023 8:21 [phone]

## 2023-01-23 NOTE — Patient Instructions (Addendum)
Handouts on hemorrhoids and diverticulosis given to you today   YOU HAD AN ENDOSCOPIC PROCEDURE TODAY AT THE  ENDOSCOPY CENTER:   Refer to the procedure report that was given to you for any specific questions about what was found during the examination.  If the procedure report does not answer your questions, please call your gastroenterologist to clarify.  If you requested that your care partner not be given the details of your procedure findings, then the procedure report has been included in a sealed envelope for you to review at your convenience later.  YOU SHOULD EXPECT: Some feelings of bloating in the abdomen. Passage of more gas than usual.  Walking can help get rid of the air that was put into your GI tract during the procedure and reduce the bloating. If you had a lower endoscopy (such as a colonoscopy or flexible sigmoidoscopy) you may notice spotting of blood in your stool or on the toilet paper. If you underwent a bowel prep for your procedure, you may not have a normal bowel movement for a few days.  Please Note:  You might notice some irritation and congestion in your nose or some drainage.  This is from the oxygen used during your procedure.  There is no need for concern and it should clear up in a day or so.  SYMPTOMS TO REPORT IMMEDIATELY:  Following lower endoscopy (colonoscopy or flexible sigmoidoscopy):  Excessive amounts of blood in the stool  Significant tenderness or worsening of abdominal pains  Swelling of the abdomen that is new, acute  Fever of 100F or higher  For urgent or emergent issues, a gastroenterologist can be reached at any hour by calling [phone]. Do not use MyChart messaging for urgent concerns.    DIET:  We do recommend a small meal at first, but then you may proceed to your regular diet.  Drink plenty of fluids but you should avoid alcoholic beverages for 24 hours.  ACTIVITY:  You should plan to take it easy for the rest of today and you  should NOT DRIVE or use heavy machinery until tomorrow (because of the sedation medicines used during the test).    FOLLOW UP: Our staff will call the number listed on your records the next business day following your procedure.  We will call around 7:15- 8:00 am to check on you and address any questions or concerns that you may have regarding the information given to you following your procedure. If we do not reach you, we will leave a message.     SIGNATURES/CONFIDENTIALITY: You and/or your care partner have signed paperwork which will be entered into your electronic medical record.  These signatures attest to the fact that that the information above on your After Visit Summary has been reviewed and is understood.  Full responsibility of the confidentiality of this discharge information lies with you and/or your care-partner. 

## 2023-01-23 NOTE — Op Note (Signed)
[facility] Patient Name: Thomas Levy Procedure Date: 01/23/2023 8:08 AM MRN: [id number] Endoscopist: Lynann Bologna , MD, [id number] Age: 67 Referring MD:  Date of Birth: [birth year] Gender: Male Account #: [id number] Procedure:                Colonoscopy Indications:              Screening for colorectal malignant neoplasm Medicines:                Monitored Anesthesia Care Procedure:                Pre-Anesthesia Assessment:                           - Prior to the procedure, a History and Physical                            was performed, and patient medications and                            allergies were reviewed. The patient's tolerance of                            previous anesthesia was also reviewed. The risks                            and benefits of the procedure and the sedation                            options and risks were discussed with the patient.                            All questions were answered, and informed consent                            was obtained. Prior Anticoagulants: The patient has                            taken no anticoagulant or antiplatelet agents. ASA                            Grade Assessment: II - A patient with mild systemic                            disease. After reviewing the risks and benefits,                            the patient was deemed in satisfactory condition to                            undergo the procedure.                           After obtaining informed consent, the colonoscope  was passed under direct vision. Throughout the                            procedure, the patient's blood pressure, pulse, and                            oxygen saturations were monitored continuously. The                            Olympus PCF-H190DL (#[id number]) Colonoscope was                            introduced through the anus and advanced to the 2                            cm into the ileum. The  colonoscopy was performed                            without difficulty. The patient tolerated the                            procedure well. The quality of the bowel                            preparation was good. The terminal ileum, ileocecal                            valve, appendiceal orifice, and rectum were                            photographed. Scope In: 8:29:36 AM Scope Out: 8:42:50 AM Scope Withdrawal Time: 0 hours 8 minutes 50 seconds  Total Procedure Duration: 0 hours 13 minutes 14 seconds  Findings:                 A few small-mouthed diverticula were found in the                            sigmoid colon and descending colon.                           Non-bleeding internal hemorrhoids were found during                            retroflexion. The hemorrhoids were small and Grade                            I (internal hemorrhoids that do not prolapse).                           The terminal ileum appeared normal.                           The exam was otherwise without abnormality on  direct and retroflexion views. Complications:            No immediate complications. Estimated Blood Loss:     Estimated blood loss: none. Impression:               - Mild left colonic diverticulosis.                           - Non-bleeding internal hemorrhoids.                           - The examined portion of the ileum was normal.                           - The examination was otherwise normal on direct                            and retroflexion views.                           - No specimens collected. Recommendation:           - Patient has a contact number available for                            emergencies. The signs and symptoms of potential                            delayed complications were discussed with the                            patient. Return to normal activities tomorrow.                            Written discharge instructions were  provided to the                            patient.                           - High fiber diet.                           - Continue present medications.                           - Repeat colonoscopy is not recommended due to                            current age (39 years or older) for screening                            purposes. Earlier, if any new problems or change in                            family history.                           -  Return to GI clinic PRN.                           - The findings and recommendations were discussed                            with the patient's family. Lynann Bologna, MD 01/23/2023 8:51:13 AM This report has been signed electronically.

## 2023-01-23 NOTE — Progress Notes (Signed)
Uneventful anesthetic. Report to pacu rn. Vss. Care resumed by rn. 

## 2023-01-23 NOTE — Progress Notes (Signed)
Pt's states no medical or surgical changes since previsit or office visit. 

## 2023-01-24 ENCOUNTER — Telehealth: Payer: Self-pay

## 2023-01-24 NOTE — Telephone Encounter (Signed)
Attempted f/u call. No answer, left VM. 

## 2023-01-31 ENCOUNTER — Other Ambulatory Visit: Payer: Self-pay | Admitting: Family Medicine

## 2023-01-31 DIAGNOSIS — M109 Gout, unspecified: Secondary | ICD-10-CM

## 2023-02-14 ENCOUNTER — Other Ambulatory Visit: Payer: Self-pay | Admitting: Family Medicine

## 2023-02-14 DIAGNOSIS — J9801 Acute bronchospasm: Secondary | ICD-10-CM

## 2023-02-19 ENCOUNTER — Encounter: Payer: Self-pay | Admitting: Family Medicine

## 2023-02-20 ENCOUNTER — Other Ambulatory Visit: Payer: Self-pay | Admitting: Family Medicine

## 2023-02-20 DIAGNOSIS — L309 Dermatitis, unspecified: Secondary | ICD-10-CM

## 2023-02-20 MED ORDER — HALOBETASOL PROPIONATE 0.05 % EX CREA: TOPICAL_CREAM | Freq: Two times a day (BID) | CUTANEOUS | 0 refills | Status: DC

## 2023-02-28 ENCOUNTER — Ambulatory Visit: Payer: Medicare HMO | Admitting: Neurology

## 2023-03-09 ENCOUNTER — Other Ambulatory Visit: Payer: Self-pay | Admitting: Family Medicine

## 2023-04-25 ENCOUNTER — Encounter: Payer: Self-pay | Admitting: Neurology

## 2023-04-26 ENCOUNTER — Other Ambulatory Visit: Payer: Self-pay

## 2023-04-26 DIAGNOSIS — G20A1 Parkinson's disease without dyskinesia, without mention of fluctuations: Secondary | ICD-10-CM

## 2023-05-01 ENCOUNTER — Other Ambulatory Visit (HOSPITAL_COMMUNITY): Payer: Self-pay

## 2023-05-09 ENCOUNTER — Encounter: Payer: Self-pay | Admitting: Family Medicine

## 2023-05-09 DIAGNOSIS — M109 Gout, unspecified: Secondary | ICD-10-CM

## 2023-05-09 DIAGNOSIS — L309 Dermatitis, unspecified: Secondary | ICD-10-CM

## 2023-05-09 DIAGNOSIS — J9801 Acute bronchospasm: Secondary | ICD-10-CM

## 2023-05-09 DIAGNOSIS — E785 Hyperlipidemia, unspecified: Secondary | ICD-10-CM

## 2023-05-09 DIAGNOSIS — N529 Male erectile dysfunction, unspecified: Secondary | ICD-10-CM

## 2023-05-09 MED ORDER — LISINOPRIL 10 MG PO TABS: 10.0000 mg | ORAL_TABLET | Freq: Every day | ORAL | 1 refills | Status: AC

## 2023-05-09 MED ORDER — ALLOPURINOL 300 MG PO TABS: 300.0000 mg | ORAL_TABLET | Freq: Every day | ORAL | 1 refills | Status: AC

## 2023-05-09 MED ORDER — ALBUTEROL SULFATE HFA 108 (90 BASE) MCG/ACT IN AERS: 2.0000 | INHALATION_SPRAY | Freq: Four times a day (QID) | RESPIRATORY_TRACT | 5 refills | Status: AC | PRN

## 2023-05-09 MED ORDER — ATORVASTATIN CALCIUM 20 MG PO TABS: 20.0000 mg | ORAL_TABLET | Freq: Every day | ORAL | 1 refills | Status: DC

## 2023-05-09 MED ORDER — HALOBETASOL PROPIONATE 0.05 % EX CREA: TOPICAL_CREAM | Freq: Two times a day (BID) | CUTANEOUS | 0 refills | Status: AC

## 2023-05-09 MED ORDER — SILDENAFIL CITRATE 20 MG PO TABS: ORAL_TABLET | ORAL | 2 refills | Status: AC

## 2023-05-18 NOTE — Telephone Encounter (Signed)
Pt called in stating [facility] has not received his referral. He would like for it to be sent in again.

## 2023-05-21 ENCOUNTER — Other Ambulatory Visit: Payer: Self-pay

## 2023-05-25 ENCOUNTER — Other Ambulatory Visit: Payer: Self-pay | Admitting: Family Medicine

## 2023-05-25 DIAGNOSIS — E785 Hyperlipidemia, unspecified: Secondary | ICD-10-CM

## 2023-05-29 ENCOUNTER — Other Ambulatory Visit: Payer: Self-pay | Admitting: Neurology

## 2023-05-31 ENCOUNTER — Ambulatory Visit: Payer: Medicare HMO | Admitting: Family Medicine

## 2023-06-13 ENCOUNTER — Ambulatory Visit: Payer: Medicare HMO | Admitting: Neurology

## 2023-07-03 ENCOUNTER — Encounter: Payer: Self-pay | Admitting: Family Medicine

## 2023-09-03 ENCOUNTER — Telehealth: Payer: Self-pay

## 2023-09-03 NOTE — Telephone Encounter (Signed)
 Copied from CRM [id number]. Topic: Clinical - Medication Refill >> Sep 03, 2023 12:45 PM Tiffany H wrote: Most Recent Primary Care Visit:  Provider: ANTONIO CYNDEE ROCKERS R  Department: [facility]  Visit Type: PHYSICAL  Date: 11/10/2022  Medication: Temazepam 30mg  1x per night  Has the patient contacted their pharmacy? Yes (Agent: If no, request that the patient contact the pharmacy for the refill. If patient does not wish to contact the pharmacy document the reason why and proceed with request.) (Agent: If yes, when and what did the pharmacy advise?) Thomas Levy is requiring a prior authorization.   Is this the correct pharmacy for this prescription? Yes If no, delete pharmacy and type the correct one.  This is the patient's preferred pharmacy:   [facility] [address] [place], [place] [address] [phone]  Has the prescription been filled recently? No.   Is the patient out of the medication? Yes  Has the patient been seen for an appointment in the last year OR does the patient have an upcoming appointment? Yes  Can we respond through MyChart? Yes  Agent: Please be advised that Rx refills may take up to 3 business days. We ask that you follow-up with your pharmacy.

## 2023-09-03 NOTE — Telephone Encounter (Signed)
Pt no longer under PCP care.

## 2023-10-02 ENCOUNTER — Telehealth: Payer: Self-pay | Admitting: Pulmonary Disease

## 2023-10-02 ENCOUNTER — Encounter: Payer: Self-pay | Admitting: Family Medicine

## 2023-10-02 NOTE — Telephone Encounter (Signed)
Patient needs sleep study to be sent to [facility] located [facility] [phone] Attention: Dr.Jessica Ng    *I was unable to locate the supporting documents and graph in his chart

## 2023-10-03 ENCOUNTER — Encounter (HOSPITAL_BASED_OUTPATIENT_CLINIC_OR_DEPARTMENT_OTHER): Payer: Self-pay

## 2024-01-30 ENCOUNTER — Other Ambulatory Visit: Payer: Self-pay | Admitting: Family Medicine

## 2024-01-30 DIAGNOSIS — M109 Gout, unspecified: Secondary | ICD-10-CM

## 2024-02-03 ENCOUNTER — Other Ambulatory Visit: Payer: Self-pay | Admitting: Family Medicine

## 2024-02-03 DIAGNOSIS — M109 Gout, unspecified: Secondary | ICD-10-CM

## 2024-02-03 DIAGNOSIS — E785 Hyperlipidemia, unspecified: Secondary | ICD-10-CM

## 2024-02-19 ENCOUNTER — Other Ambulatory Visit: Payer: Self-pay | Admitting: Family Medicine

## 2024-02-19 DIAGNOSIS — M109 Gout, unspecified: Secondary | ICD-10-CM

## 2024-02-19 DIAGNOSIS — E785 Hyperlipidemia, unspecified: Secondary | ICD-10-CM

## 2024-02-22 ENCOUNTER — Other Ambulatory Visit: Payer: Self-pay | Admitting: Family Medicine

## 2024-02-22 DIAGNOSIS — E785 Hyperlipidemia, unspecified: Secondary | ICD-10-CM

## 2024-02-22 DIAGNOSIS — M109 Gout, unspecified: Secondary | ICD-10-CM

## 2024-05-12 ENCOUNTER — Other Ambulatory Visit: Payer: Self-pay | Admitting: Family Medicine

## 2024-05-12 DIAGNOSIS — J9801 Acute bronchospasm: Secondary | ICD-10-CM

## 2024-05-15 ENCOUNTER — Other Ambulatory Visit: Payer: Self-pay | Admitting: Family Medicine

## 2024-05-15 DIAGNOSIS — J9801 Acute bronchospasm: Secondary | ICD-10-CM
